# Patient Record
Sex: Female | Born: 1975 | Race: Black or African American | Hispanic: No | Marital: Single | Smoking: Never smoker
Health system: Southern US, Community
[De-identification: ages and names within clinical notes are randomized; demographics above are authoritative.]

## PROBLEM LIST (undated history)

## (undated) DIAGNOSIS — A599 Trichomoniasis, unspecified: Secondary | ICD-10-CM

## (undated) HISTORY — PX: INDUCED ABORTION: SHX677

---

## 2013-05-23 ENCOUNTER — Encounter (HOSPITAL_BASED_OUTPATIENT_CLINIC_OR_DEPARTMENT_OTHER): Payer: Self-pay | Admitting: Emergency Medicine

## 2013-05-23 ENCOUNTER — Emergency Department (HOSPITAL_BASED_OUTPATIENT_CLINIC_OR_DEPARTMENT_OTHER)
Admission: EM | Admit: 2013-05-23 | Discharge: 2013-05-23 | Disposition: A | Discharge status: Home / Self Care | Payer: BC Managed Care – PPO | Attending: Emergency Medicine | Admitting: Emergency Medicine

## 2013-05-23 DIAGNOSIS — K047 Periapical abscess without sinus: Secondary | ICD-10-CM | POA: Insufficient documentation

## 2013-05-23 DIAGNOSIS — K002 Abnormalities of size and form of teeth: Secondary | ICD-10-CM | POA: Insufficient documentation

## 2013-05-23 DIAGNOSIS — K089 Disorder of teeth and supporting structures, unspecified: Secondary | ICD-10-CM | POA: Insufficient documentation

## 2013-05-23 DIAGNOSIS — K0381 Cracked tooth: Secondary | ICD-10-CM | POA: Insufficient documentation

## 2013-05-23 DIAGNOSIS — K029 Dental caries, unspecified: Secondary | ICD-10-CM | POA: Insufficient documentation

## 2013-05-23 DIAGNOSIS — K0889 Other specified disorders of teeth and supporting structures: Secondary | ICD-10-CM

## 2013-05-23 DIAGNOSIS — R220 Localized swelling, mass and lump, head: Secondary | ICD-10-CM | POA: Insufficient documentation

## 2013-05-23 MED ORDER — HYDROCODONE-ACETAMINOPHEN 5-325 MG PO TABS: 1.0000 | ORAL_TABLET | Freq: Four times a day (QID) | ORAL | Status: DC | PRN

## 2013-05-23 MED ORDER — PENICILLIN V POTASSIUM 500 MG PO TABS: 500.0000 mg | ORAL_TABLET | Freq: Three times a day (TID) | ORAL | Status: DC

## 2013-05-23 MED ORDER — NAPROXEN 500 MG PO TABS: 500.0000 mg | ORAL_TABLET | Freq: Two times a day (BID) | ORAL | Status: DC

## 2013-05-23 NOTE — ED Provider Notes (Signed)
CSN: [id number]     Arrival date & time 05/23/13  1459 History   First MD Initiated Contact with Patient 05/23/13 1510     Chief Complaint  Patient presents with  . Dental Pain   (Consider location/radiation/quality/duration/timing/severity/associated sxs/prior Treatment) Patient is a 37 y.o. female presenting with tooth pain. The history is provided by the patient. No language interpreter was used.  Dental Pain Location:  Upper Upper teeth location:  16/LU 3rd molar Quality:  Pulsating and throbbing Severity:  Moderate Onset quality:  Sudden Duration:  1 day Timing:  Constant Chronicity:  New Context: dental fracture   Ineffective treatments:  None tried Associated symptoms: facial swelling   Associated symptoms: no fever and no neck swelling     History reviewed. No pertinent past medical history. History reviewed. No pertinent past surgical history. History reviewed. No pertinent family history. History  Substance Use Topics  . Smoking status: Never Smoker   . Smokeless tobacco: Not on file  . Alcohol Use: No   OB History   Grav Para Term Preterm Abortions TAB SAB Ect Mult Living                 Review of Systems  Constitutional: Negative for fever.  HENT: Positive for facial swelling.   All other systems reviewed and are negative.    Allergies  Review of patient's allergies indicates no known allergies.  Home Medications  No current outpatient prescriptions on file. BP 122/72  Pulse 93  Temp(Src) 98.8 F (37.1 C) (Oral)  Resp 16  Ht 5\' 4"  (1.626 m)  Wt 170 lb (77.111 kg)  BMI 29.17 kg/m2  SpO2 100% Physical Exam  Nursing note and vitals reviewed. Constitutional: She is oriented to person, place, and time. She appears well-developed and well-nourished.  HENT:  Head: Normocephalic.  Mouth/Throat: Abnormal dentition. Dental abscesses present.    Eyes: Conjunctivae are normal. Pupils are equal, round, and reactive to light.  Neck: Normal range of  motion.  Cardiovascular: Normal rate and regular rhythm.   Pulmonary/Chest: Effort normal and breath sounds normal.  Abdominal: Soft. Bowel sounds are normal.  Musculoskeletal: Normal range of motion.  Lymphadenopathy:    She has no cervical adenopathy.  Neurological: She is alert and oriented to person, place, and time.  Skin: Skin is warm and dry.  Psychiatric: She has a normal mood and affect. Her behavior is normal. Judgment and thought content normal.    ED Course  Procedures (including critical care time) Labs Review Labs Reviewed - No data to display Imaging Review No results found.  EKG Interpretation   None       MDM  Dental pain due to fractured tooth and infection.  Antibiotic, anti-inflammatory.  Referral to dentistry.  Resource information provided.    Jimmye Norman, NP 05/23/13 1714

## 2013-05-23 NOTE — ED Provider Notes (Signed)
Medical screening examination/treatment/procedure(s) were performed by non-physician practitioner and as supervising physician I was immediately available for consultation/collaboration.  EKG Interpretation   None         Charles B. Bernette Mayers, MD 05/23/13 [phone]

## 2013-05-23 NOTE — ED Notes (Signed)
Pt c/o dental pain x 1 day  

## 2014-06-08 ENCOUNTER — Emergency Department (HOSPITAL_BASED_OUTPATIENT_CLINIC_OR_DEPARTMENT_OTHER): Payer: Medicaid Other

## 2014-06-08 ENCOUNTER — Encounter (HOSPITAL_BASED_OUTPATIENT_CLINIC_OR_DEPARTMENT_OTHER): Payer: Self-pay | Admitting: Emergency Medicine

## 2014-06-08 ENCOUNTER — Emergency Department (HOSPITAL_BASED_OUTPATIENT_CLINIC_OR_DEPARTMENT_OTHER)
Admission: EM | Admit: 2014-06-08 | Discharge: 2014-06-09 | Disposition: A | Discharge status: Home / Self Care | Payer: Medicaid Other | Attending: Emergency Medicine | Admitting: Emergency Medicine

## 2014-06-08 DIAGNOSIS — Z791 Long term (current) use of non-steroidal anti-inflammatories (NSAID): Secondary | ICD-10-CM | POA: Insufficient documentation

## 2014-06-08 DIAGNOSIS — R0602 Shortness of breath: Secondary | ICD-10-CM | POA: Diagnosis present

## 2014-06-08 DIAGNOSIS — R0789 Other chest pain: Secondary | ICD-10-CM | POA: Diagnosis not present

## 2014-06-08 DIAGNOSIS — R079 Chest pain, unspecified: Secondary | ICD-10-CM

## 2014-06-08 LAB — CBC WITH DIFFERENTIAL/PLATELET
Basophils Absolute: 0 10*3/uL (ref 0.0–0.1)
Basophils Relative: 0 % (ref 0–1)
Eosinophils Absolute: 0.1 10*3/uL (ref 0.0–0.7)
Eosinophils Relative: 2 % (ref 0–5)
HCT: 34.5 % — ABNORMAL LOW (ref 36.0–46.0)
HEMOGLOBIN: 11.6 g/dL — AB (ref 12.0–15.0)
LYMPHS ABS: 2.8 10*3/uL (ref 0.7–4.0)
LYMPHS PCT: 38 % (ref 12–46)
MCH: 26.4 pg (ref 26.0–34.0)
MCHC: 33.6 g/dL (ref 30.0–36.0)
MCV: 78.4 fL (ref 78.0–100.0)
MONOS PCT: 9 % (ref 3–12)
Monocytes Absolute: 0.7 10*3/uL (ref 0.1–1.0)
NEUTROS PCT: 51 % (ref 43–77)
Neutro Abs: 3.9 10*3/uL (ref 1.7–7.7)
Platelets: 259 10*3/uL (ref 150–400)
RBC: 4.4 MIL/uL (ref 3.87–5.11)
RDW: 13.4 % (ref 11.5–15.5)
WBC: 7.5 10*3/uL (ref 4.0–10.5)

## 2014-06-08 NOTE — ED Notes (Signed)
Pt states that she started having shoulder pain yesterday, denies any injury.  Pt states she is only SOB when she lie down.

## 2014-06-08 NOTE — ED Provider Notes (Signed)
CSN: [id number]     Arrival date & time 06/08/14  2214 History  This chart was scribed for Gilda Creasehristopher J. Jalexis Breed, * by Roxy Cedarhandni Bhalodia, ED Scribe. This patient was seen in room MH12/MH12 and the patient's care was started at 11:28 PM.   Chief Complaint  Patient presents with  . bilateral shoulder pain    The history is provided by the patient. No language interpreter was used.   HPI Comments: Christy Carroll is a 38 y.o. female who presents to the Emergency Department complaining of moderate SOB that began yesterday. She denies associated cough, SOB or sore throat. She states that the pain hurts the most in clavicular area. She states that the pain worsens when sitting down for long periods of time.  History reviewed. No pertinent past medical history. History reviewed. No pertinent past surgical history. History reviewed. No pertinent family history. History  Substance Use Topics  . Smoking status: Never Smoker   . Smokeless tobacco: Not on file  . Alcohol Use: No   OB History    No data available     Review of Systems  Musculoskeletal: Positive for arthralgias.  All other systems reviewed and are negative.  Allergies  Review of patient's allergies indicates no known allergies.  Home Medications   Prior to Admission medications   Medication Sig Start Date End Date Taking? Authorizing Provider  naproxen (NAPROSYN) 500 MG tablet Take 1 tablet (500 mg total) by mouth 2 (two) times daily. 06/09/14   Gilda Creasehristopher J. Magaby Rumberger, MD  traMADol (ULTRAM) 50 MG tablet Take 1 tablet (50 mg total) by mouth every 6 (six) hours as needed. 06/09/14   Gilda Creasehristopher J. Glorianna Gott, MD   Triage Vitals: BP 125/72 mmHg  Pulse 82  Temp(Src) 98 F (36.7 C) (Oral)  Resp 18  Ht 5\' 5"  (1.651 m)  Wt 177 lb (80.287 kg)  BMI 29.45 kg/m2  SpO2 100%  Physical Exam  Constitutional: She is oriented to person, place, and time. She appears well-developed and well-nourished. No distress.  HENT:  Head:  Normocephalic and atraumatic.  Right Ear: Hearing normal.  Left Ear: Hearing normal.  Nose: Nose normal.  Mouth/Throat: Oropharynx is clear and moist and mucous membranes are normal.  Eyes: Conjunctivae and EOM are normal. Pupils are equal, round, and reactive to light.  Neck: Normal range of motion. Neck supple.  Cardiovascular: Regular rhythm, S1 normal and S2 normal.  Exam reveals no gallop and no friction rub.   No murmur heard. Pulmonary/Chest: Effort normal and breath sounds normal. No respiratory distress. She exhibits tenderness.  Abdominal: Soft. Normal appearance and bowel sounds are normal. There is no hepatosplenomegaly. There is no tenderness. There is no rebound, no guarding, no tenderness at McBurney's point and negative Murphy's sign. No hernia.  Musculoskeletal: Normal range of motion. She exhibits tenderness.  Tenderness across upper chest wall.  Neurological: She is alert and oriented to person, place, and time. She has normal strength. No cranial nerve deficit or sensory deficit. Coordination normal. GCS eye subscore is 4. GCS verbal subscore is 5. GCS motor subscore is 6.  Skin: Skin is warm, dry and intact. No rash noted. No cyanosis.  Psychiatric: She has a normal mood and affect. Her speech is normal and behavior is normal. Thought content normal.  Nursing note and vitals reviewed.  ED Course  Procedures (including critical care time)  DIAGNOSTIC STUDIES: Oxygen Saturation is 100% on RA, normal by my interpretation.    COORDINATION OF CARE: 11:29 PM- Discussed  plans to order diagnostic CXR, EKG and lab work. Pt advised of plan for treatment and pt agrees.  Labs Review Labs Reviewed  CBC WITH DIFFERENTIAL - Abnormal; Notable for the following:    Hemoglobin 11.6 (*)    HCT 34.5 (*)    All other components within normal limits  BASIC METABOLIC PANEL - Abnormal; Notable for the following:    Potassium 3.4 (*)    Glucose, Bld 104 (*)    All other components  within normal limits  TROPONIN I    Imaging Review Dg Chest 2 View  06/09/2014   CLINICAL DATA:  Mid chest pain for 3 days.  EXAM: CHEST  2 VIEW  COMPARISON:  None.  FINDINGS: Normal heart size and mediastinal contours. No acute infiltrate or edema. No effusion or pneumothorax. No acute osseous findings.  IMPRESSION: Negative chest.   Electronically Signed   By: Tiburcio PeaJonathan  Watts M.D.   On: 06/09/2014 00:05     EKG Interpretation   Date/Time:  Wednesday June 08 2014 22:24:07 EST Ventricular Rate:  84 PR Interval:  136 QRS Duration: 84 QT Interval:  368 QTC Calculation: 434 R Axis:   77 Text Interpretation:  Normal sinus rhythm Normal ECG Confirmed by Khiem Gargis   MD, [place] [phone]([id number]) on 06/08/2014 11:29:26 PM     MDM   Final diagnoses:  Chest pain  Chest wall pain    Resents to the ER for evaluation of pain across the anterior upper chest bilaterally. Patient reports that she has noticed some increased pain when she lies down, but the pain is also reproducible with movement and palpation. Patient has a normal EKG. Chest x-ray was normal. Vital signs are normal. She does not have any tachycardia. Symptoms are more consistent with chest wall pain than PE. PERC/[facility] . Negative for PE, no workup necessary. Patient was reassured, symptoms most consistent with musculoskeletal pain and will treat symptomatically.  I personally performed the services described in this documentation, which was scribed in my presence. The recorded information has been reviewed and is accurate.  Gilda Creasehristopher J. Trinika Cortese, MD 06/09/14 [phone]

## 2014-06-09 LAB — BASIC METABOLIC PANEL
Anion gap: 6 (ref 5–15)
BUN: 10 mg/dL (ref 6–23)
CO2: 26 mmol/L (ref 19–32)
Calcium: 8.9 mg/dL (ref 8.4–10.5)
Chloride: 106 mEq/L (ref 96–112)
Creatinine, Ser: 0.73 mg/dL (ref 0.50–1.10)
GFR calc Af Amer: >90 mL/min (ref >90)
GFR calc non Af Amer: >90 mL/min (ref >90)
GLUCOSE: 104 mg/dL — AB (ref 70–99)
POTASSIUM: 3.4 mmol/L — AB (ref 3.5–5.1)
SODIUM: 138 mmol/L (ref 135–145)

## 2014-06-09 LAB — TROPONIN I: Troponin I: <0.03 ng/mL (ref <0.031)

## 2014-06-09 MED ORDER — TRAMADOL HCL 50 MG PO TABS: 50.0000 mg | ORAL_TABLET | Freq: Four times a day (QID) | ORAL | Status: DC | PRN

## 2014-06-09 MED ORDER — NAPROXEN 500 MG PO TABS: 500.0000 mg | ORAL_TABLET | Freq: Two times a day (BID) | ORAL | Status: DC

## 2014-06-09 NOTE — Discharge Instructions (Signed)

## 2014-06-12 ENCOUNTER — Emergency Department (HOSPITAL_BASED_OUTPATIENT_CLINIC_OR_DEPARTMENT_OTHER)
Admission: EM | Admit: 2014-06-12 | Discharge: 2014-06-12 | Disposition: A | Discharge status: Home / Self Care | Payer: Medicaid Other | Attending: Emergency Medicine | Admitting: Emergency Medicine

## 2014-06-12 ENCOUNTER — Encounter (HOSPITAL_BASED_OUTPATIENT_CLINIC_OR_DEPARTMENT_OTHER): Payer: Self-pay | Admitting: *Deleted

## 2014-06-12 DIAGNOSIS — Z3202 Encounter for pregnancy test, result negative: Secondary | ICD-10-CM | POA: Insufficient documentation

## 2014-06-12 DIAGNOSIS — A599 Trichomoniasis, unspecified: Secondary | ICD-10-CM

## 2014-06-12 DIAGNOSIS — N390 Urinary tract infection, site not specified: Secondary | ICD-10-CM | POA: Insufficient documentation

## 2014-06-12 DIAGNOSIS — Z791 Long term (current) use of non-steroidal anti-inflammatories (NSAID): Secondary | ICD-10-CM | POA: Diagnosis not present

## 2014-06-12 DIAGNOSIS — A5901 Trichomonal vulvovaginitis: Secondary | ICD-10-CM | POA: Diagnosis not present

## 2014-06-12 DIAGNOSIS — N898 Other specified noninflammatory disorders of vagina: Secondary | ICD-10-CM | POA: Diagnosis present

## 2014-06-12 LAB — WET PREP, GENITAL
Clue Cells Wet Prep HPF POC: NONE SEEN
YEAST WET PREP: NONE SEEN

## 2014-06-12 LAB — URINALYSIS, ROUTINE W REFLEX MICROSCOPIC
Bilirubin Urine: NEGATIVE
Glucose, UA: NEGATIVE mg/dL
Ketones, ur: NEGATIVE mg/dL
Nitrite: NEGATIVE
PH: 6.5 (ref 5.0–8.0)
Protein, ur: NEGATIVE mg/dL
SPECIFIC GRAVITY, URINE: 1.018 (ref 1.005–1.030)
Urobilinogen, UA: 1 mg/dL (ref 0.0–1.0)

## 2014-06-12 LAB — URINE MICROSCOPIC-ADD ON

## 2014-06-12 LAB — HIV ANTIBODY (ROUTINE TESTING W REFLEX): HIV 1&2 Ab, 4th Generation: NONREACTIVE

## 2014-06-12 LAB — RPR

## 2014-06-12 LAB — PREGNANCY, URINE: Preg Test, Ur: NEGATIVE

## 2014-06-12 MED ORDER — METRONIDAZOLE 500 MG PO TABS: 500.0000 mg | ORAL_TABLET | Freq: Two times a day (BID) | ORAL | Status: DC

## 2014-06-12 MED ORDER — CEPHALEXIN 500 MG PO CAPS: 500.0000 mg | ORAL_CAPSULE | Freq: Four times a day (QID) | ORAL | Status: DC

## 2014-06-12 NOTE — ED Notes (Signed)
Patient states she is having lower abd pain and vaginal discharge that she thought was a yeast in fection, but otc meds are not working

## 2014-06-12 NOTE — Discharge Instructions (Signed)
Trichomoniasis °Trichomoniasis is an infection caused by an organism called Trichomonas. The infection can affect both women and men. In women, the outer female genitalia and the vagina are affected. In men, the penis is mainly affected, but the prostate and other reproductive organs can also be involved. Trichomoniasis is a sexually transmitted infection (STI) and is most often passed to another person through sexual contact.  °RISK FACTORS °· Having unprotected sexual intercourse. °· Having sexual intercourse with an infected partner. °SIGNS AND SYMPTOMS  °Symptoms of trichomoniasis in women include: °· Abnormal gray-green frothy vaginal discharge. °· Itching and irritation of the vagina. °· Itching and irritation of the area outside the vagina. °Symptoms of trichomoniasis in men include:  °· Penile discharge with or without pain. °· Pain during urination. This results from inflammation of the urethra. °DIAGNOSIS  °Trichomoniasis may be found during a Pap test or physical exam. Your health care provider may use one of the following methods to help diagnose this infection: °· Examining vaginal discharge under a microscope. For men, urethral discharge would be examined. °· Testing the pH of the vagina with a test tape. °· Using a vaginal swab test that checks for the Trichomonas organism. A test is available that provides results within a few minutes. °· Doing a culture test for the organism. This is not usually needed. °TREATMENT  °· You may be given medicine to fight the infection. Women should inform their health care provider if they could be or are pregnant. Some medicines used to treat the infection should not be taken during pregnancy. °· Your health care provider may recommend over-the-counter medicines or creams to decrease itching or irritation. °· Your sexual partner will need to be treated if infected. °HOME CARE INSTRUCTIONS  °· Take medicines only as directed by your health care provider. °· Take  over-the-counter medicine for itching or irritation as directed by your health care provider. °· Do not have sexual intercourse while you have the infection. °· Women should not douche or wear tampons while they have the infection. °· Discuss your infection with your partner. Your partner may have gotten the infection from you, or you may have gotten it from your partner. °· Have your sex partner get examined and treated if necessary. °· Practice safe, informed, and protected sex. °· See your health care provider for other STI testing. °SEEK MEDICAL CARE IF:  °· You still have symptoms after you finish your medicine. °· You develop abdominal pain. °· You have pain when you urinate. °· You have bleeding after sexual intercourse. °· You develop a rash. °· Your medicine makes you sick or makes you throw up (vomit). °MAKE SURE YOU: °· Understand these instructions. °· Will watch your condition. °· Will get help right away if you are not doing well or get worse. °Document Released: 11/20/2000 Document Revised: 10/11/2013 Document Reviewed: 03/08/2013 °ExitCare® Patient Information ©2015 [employer]. This information is not intended to replace advice given to you by your health care provider. Make sure you discuss any questions you have with your health care provider. ° °Urinary Tract Infection °Urinary tract infections (UTIs) can develop anywhere along your urinary tract. Your urinary tract is your body's drainage system for removing wastes and extra water. Your urinary tract includes two kidneys, two ureters, a bladder, and a urethra. Your kidneys are a pair of bean-shaped organs. Each kidney is about the size of your fist. They are located below your ribs, one on each side of your spine. °CAUSES °Infections   are caused by microbes, which are microscopic organisms, including fungi, viruses, and bacteria. These organisms are so small that they can only be seen through a microscope. Bacteria are the microbes that most  commonly cause UTIs. °SYMPTOMS  °Symptoms of UTIs may vary by age and gender of the patient and by the location of the infection. Symptoms in young women typically include a frequent and intense urge to urinate and a painful, burning feeling in the bladder or urethra during urination. Older women and men are more likely to be tired, shaky, and weak and have muscle aches and abdominal pain. A fever may mean the infection is in your kidneys. Other symptoms of a kidney infection include pain in your back or sides below the ribs, nausea, and vomiting. °DIAGNOSIS °To diagnose a UTI, your caregiver will ask you about your symptoms. Your caregiver also will ask to provide a urine sample. The urine sample will be tested for bacteria and white blood cells. White blood cells are made by your body to help fight infection. °TREATMENT  °Typically, UTIs can be treated with medication. Because most UTIs are caused by a bacterial infection, they usually can be treated with the use of antibiotics. The choice of antibiotic and length of treatment depend on your symptoms and the type of bacteria causing your infection. °HOME CARE INSTRUCTIONS °· If you were prescribed antibiotics, take them exactly as your caregiver instructs you. Finish the medication even if you feel better after you have only taken some of the medication. °· Drink enough water and fluids to keep your urine clear or pale yellow. °· Avoid caffeine, tea, and carbonated beverages. They tend to irritate your bladder. °· Empty your bladder often. Avoid holding urine for long periods of time. °· Empty your bladder before and after sexual intercourse. °· After a bowel movement, women should cleanse from front to back. Use each tissue only once. °SEEK MEDICAL CARE IF:  °· You have back pain. °· You develop a fever. °· Your symptoms do not begin to resolve within 3 days. °SEEK IMMEDIATE MEDICAL CARE IF:  °· You have severe back pain or lower abdominal pain. °· You develop  chills. °· You have nausea or vomiting. °· You have continued burning or discomfort with urination. °MAKE SURE YOU:  °· Understand these instructions. °· Will watch your condition. °· Will get help right away if you are not doing well or get worse. °Document Released: 03/06/2005 Document Revised: 11/26/2011 Document Reviewed: 07/05/2011 °ExitCare® Patient Information ©2015 [employer]. This information is not intended to replace advice given to you by your health care provider. Make sure you discuss any questions you have with your health care provider. ° °

## 2014-06-12 NOTE — ED Provider Notes (Signed)
CSN: [id number]     Arrival date & time 06/12/14  1416 History   First MD Initiated Contact with Patient 06/12/14 1530     Chief Complaint  Patient presents with  . Vaginal Discharge   Christy Carroll is a 39 y.o. female who presents to emergency department complaining of low abdominal pain and vaginal discharge for the past 2 days. Patient reports she's tried Monistat without relief. Patient put she sexually active and is not using protection. Patient does report a history of chlamydia. Patient is on Depo-Provera for birth control. Patient rates her abdominal pain at 5 out of 10 and worse with movement. The patient denies nausea, vomiting, constipation, diarrhea, fevers, chills, dysuria, urinary frequency, urinary urgency, hematuria, vaginal bleeding, or weakness.  (Consider location/radiation/quality/duration/timing/severity/associated sxs/prior Treatment) HPI  History reviewed. No pertinent past medical history. History reviewed. No pertinent past surgical history. No family history on file. History  Substance Use Topics  . Smoking status: Never Smoker   . Smokeless tobacco: Not on file  . Alcohol Use: No   OB History    No data available     Review of Systems  Constitutional: Negative for fever and chills.  HENT: Negative for congestion, ear pain and sore throat.   Eyes: Negative for visual disturbance.  Respiratory: Negative for cough, shortness of breath and wheezing.   Cardiovascular: Negative for chest pain and palpitations.  Gastrointestinal: Positive for abdominal pain. Negative for nausea, vomiting, diarrhea and blood in stool.  Genitourinary: Positive for vaginal discharge. Negative for dysuria, urgency, frequency, hematuria, flank pain, vaginal bleeding, difficulty urinating, genital sores, vaginal pain and pelvic pain.  Musculoskeletal: Negative for back pain and neck pain.  Skin: Negative for rash.  Neurological: Negative for headaches.      Allergies  Review of  patient's allergies indicates no known allergies.  Home Medications   Prior to Admission medications   Medication Sig Start Date End Date Taking? Authorizing Provider  cephALEXin (KEFLEX) 500 MG capsule Take 1 capsule (500 mg total) by mouth 4 (four) times daily. 06/12/14   Einar Gip Franciszek Platten, PA-C  metroNIDAZOLE (FLAGYL) 500 MG tablet Take 1 tablet (500 mg total) by mouth 2 (two) times daily. 06/12/14   Einar Gip Carisha Kantor, PA-C  naproxen (NAPROSYN) 500 MG tablet Take 1 tablet (500 mg total) by mouth 2 (two) times daily. 06/09/14   Gilda Crease, MD  traMADol (ULTRAM) 50 MG tablet Take 1 tablet (50 mg total) by mouth every 6 (six) hours as needed. 06/09/14   Gilda Crease, MD   BP 104/54 mmHg  Pulse 73  Temp(Src) 98.2 F (36.8 C) (Oral)  Resp 16  Ht  (1.651 m)  Wt 177 lb (80.287 kg)  BMI 29.45 kg/m2  SpO2 100% Physical Exam  Constitutional: She appears well-developed and well-nourished. No distress.  HENT:  Head: Normocephalic and atraumatic.  Mouth/Throat: Oropharynx is clear and moist.  Eyes: Conjunctivae are normal. Pupils are equal, round, and reactive to light. Right eye exhibits no discharge. Left eye exhibits no discharge.  Neck: Neck supple.  Cardiovascular: Normal rate, regular rhythm, normal heart sounds and intact distal pulses.  Exam reveals no gallop and no friction rub.   No murmur heard. Pulmonary/Chest: Effort normal and breath sounds normal. No respiratory distress. She has no wheezes. She has no rales.  Abdominal: Soft. Bowel sounds are normal. She exhibits no distension and no mass. There is tenderness. There is no rebound and no guarding.  Patient's abdomen is soft. There  is very mild left lower quadrant abdominal tenderness. Bowel sounds are present. No CVA tenderness. Negative psoas and obturator sign.  Genitourinary: There is no rash, tenderness, lesion or injury on the right labia. There is no rash, tenderness, lesion or injury on the  left labia. Uterus is not enlarged and not tender. Cervix exhibits no motion tenderness. Right adnexum displays no mass, no tenderness and no fullness. Left adnexum displays no mass, no tenderness and no fullness. No erythema, tenderness or bleeding in the vagina. No foreign body around the vagina. No signs of injury around the vagina. Vaginal discharge found.  Pelvic exam performed by me with female RN chaperone. Patient has no external lesions. There is a moderate amount of white vaginal discharge noted. Patient's cervix is closed. There is no cervical motion tenderness. There is no vaginal bleeding noted. There is no adnexal fullness or tenderness noted.  Musculoskeletal: She exhibits no edema.  Lymphadenopathy:    She has no cervical adenopathy.  Neurological: She is alert. Coordination normal.  Skin: Skin is warm and dry. No rash noted. She is not diaphoretic. No erythema. No pallor.  Psychiatric: She has a normal mood and affect. Her behavior is normal.  Nursing note and vitals reviewed.   ED Course  Procedures (including critical care time) Labs Review Labs Reviewed  WET PREP, GENITAL - Abnormal; Notable for the following:    Trich, Wet Prep FEW (*)    WBC, Wet Prep HPF POC TOO NUMEROUS TO COUNT (*)    All other components within normal limits  URINALYSIS, ROUTINE W REFLEX MICROSCOPIC - Abnormal; Notable for the following:    APPearance CLOUDY (*)    Hgb urine dipstick MODERATE (*)    Leukocytes, UA LARGE (*)    All other components within normal limits  URINE MICROSCOPIC-ADD ON - Abnormal; Notable for the following:    Squamous Epithelial / LPF FEW (*)    Bacteria, UA MANY (*)    All other components within normal limits  GC/CHLAMYDIA PROBE AMP  PREGNANCY, URINE  HIV ANTIBODY (ROUTINE TESTING)  RPR    Imaging Review No results found.   EKG Interpretation None      Filed Vitals:   06/12/14 1419 06/12/14 1642  BP: 125/72 104/54  Pulse: 94 73  Temp: 98.2 F (36.8 C)    TempSrc: Oral   Resp: 18 16  Height:  (1.651 m)   Weight: 177 lb (80.287 kg)   SpO2: 99% 100%     MDM   Meds given in ED:  Medications - No data to display  Discharge Medication List as of 06/12/2014  5:43 PM    START taking these medications   Details  cephALEXin (KEFLEX) 500 MG capsule Take 1 capsule (500 mg total) by mouth 4 (four) times daily., Starting 06/12/2014, Until Discontinued, Print    metroNIDAZOLE (FLAGYL) 500 MG tablet Take 1 tablet (500 mg total) by mouth 2 (two) times daily., Starting 06/12/2014, Until Discontinued, Print        Final diagnoses:  UTI (lower urinary tract infection)  Trichimoniasis   Visit there a 39-year-old female who presented to the emergency department complaining of vaginal discharge for the past 2 days. Patient also reports some suprapubic pain that she rates at 5 out of 10. Patient denies nausea, vomiting, constipation or diarrhea. Patient has fever and chills. Patient denies urinary symptoms. The patient is afebrile and nontoxic-appearing. The patient's wet prep returned with trichomonas. The patient's urinalysis indicated large leuks and  many bacteria, as well as too numerous to count white blood cells. We'll treat this patient for a urinary tract infection as well as trichomonas. Advised patient she needs to follow-up with her primary care provider this week. Advised patient return to the emergency department with new or worsening symptoms or new concerns. Patient verbalized understanding and agreement of plan.      Lawana Chambers, PA-C 06/13/14 0109  Nelia Shi, MD 06/16/14 [phone]

## 2014-06-15 LAB — GC/CHLAMYDIA PROBE AMP
CT Probe RNA: NEGATIVE
GC Probe RNA: NEGATIVE

## 2014-08-28 ENCOUNTER — Emergency Department (HOSPITAL_BASED_OUTPATIENT_CLINIC_OR_DEPARTMENT_OTHER)
Admission: EM | Admit: 2014-08-28 | Discharge: 2014-08-28 | Disposition: A | Discharge status: Home / Self Care | Payer: Medicaid Other | Attending: Emergency Medicine | Admitting: Emergency Medicine

## 2014-08-28 ENCOUNTER — Encounter (HOSPITAL_BASED_OUTPATIENT_CLINIC_OR_DEPARTMENT_OTHER): Payer: Self-pay

## 2014-08-28 DIAGNOSIS — K029 Dental caries, unspecified: Secondary | ICD-10-CM | POA: Insufficient documentation

## 2014-08-28 DIAGNOSIS — K088 Other specified disorders of teeth and supporting structures: Secondary | ICD-10-CM | POA: Diagnosis present

## 2014-08-28 MED ORDER — PENICILLIN V POTASSIUM 500 MG PO TABS: 500.0000 mg | ORAL_TABLET | Freq: Three times a day (TID) | ORAL | Status: AC

## 2014-08-28 MED ORDER — TRAMADOL HCL 50 MG PO TABS: 50.0000 mg | ORAL_TABLET | Freq: Four times a day (QID) | ORAL | Status: DC | PRN

## 2014-08-28 NOTE — Discharge Instructions (Signed)
Dental Caries  Dental caries (also called tooth decay) is the most common oral disease. It can occur at any age but is more common in children and young adults.   HOW DENTAL CARIES DEVELOPS   The process of decay begins when bacteria and foods (particularly sugars and starches) combine in your mouth to produce plaque. Plaque is a substance that sticks to the hard, outer surface of a tooth (enamel). The bacteria in plaque produce acids that attack enamel. These acids may also attack the root surface of a tooth (cementum) if it is exposed. Repeated attacks dissolve these surfaces and create holes in the tooth (cavities). If left untreated, the acids destroy the other layers of the tooth.   RISK FACTORS   Frequent sipping of sugary beverages.    Frequent snacking on sugary and starchy foods, especially those that easily get stuck in the teeth.    Poor oral hygiene.    Dry mouth.    Substance abuse such as methamphetamine abuse.    Broken or poor-fitting dental restorations.    Eating disorders.    Gastroesophageal reflux disease (GERD).    Certain radiation treatments to the head and neck.  SYMPTOMS  In the early stages of dental caries, symptoms are seldom present. Sometimes white, chalky areas may be seen on the enamel or other tooth layers. In later stages, symptoms may include:   Pits and holes on the enamel.   Toothache after sweet, hot, or cold foods or drinks are consumed.   Pain around the tooth.   Swelling around the tooth.  DIAGNOSIS   Most of the time, dental caries is detected during a regular dental checkup. A diagnosis is made after a thorough medical and dental history is taken and the surfaces of your teeth are checked for signs of dental caries. Sometimes special instruments, such as lasers, are used to check for dental caries. Dental X-ray exams may be taken so that areas not visible to the eye (such as between the contact areas of the teeth) can be checked for cavities.    TREATMENT   If dental caries is in its early stages, it may be reversed with a fluoride treatment or an application of a remineralizing agent at the dental office. Thorough brushing and flossing at home is needed to aid these treatments. If it is in its later stages, treatment depends on the location and extent of tooth destruction:    If a small area of the tooth has been destroyed, the destroyed area will be removed and cavities will be filled with a material such as gold, silver amalgam, or composite resin.    If a large area of the tooth has been destroyed, the destroyed area will be removed and a cap (crown) will be fitted over the remaining tooth structure.    If the center part of the tooth (pulp) is affected, a procedure called a root canal will be needed before a filling or crown can be placed.    If most of the tooth has been destroyed, the tooth may need to be pulled (extracted).  HOME CARE INSTRUCTIONS  You can prevent, stop, or reverse dental caries at home by practicing good oral hygiene. Good oral hygiene includes:   Thoroughly cleaning your teeth at least twice a day with a toothbrush and dental floss.    Using a fluoride toothpaste. A fluoride mouth rinse may also be used if recommended by your dentist or health care provider.    Restricting   the amount of sugary and starchy foods and sugary liquids you consume.    Avoiding frequent snacking on these foods and sipping of these liquids.    Keeping regular visits with a dentist for checkups and cleanings.  PREVENTION    Practice good oral hygiene.   Consider a dental sealant. A dental sealant is a coating material that is applied by your dentist to the pits and grooves of teeth. The sealant prevents food from being trapped in them. It may protect the teeth for several years.   Ask about fluoride supplements if you live in a community without fluorinated water or with water that has a low fluoride content. Use fluoride supplements  as directed by your dentist or health care provider.   Allow fluoride varnish applications to teeth if directed by your dentist or health care provider.  Document Released: 02/16/2002 Document Revised: 10/11/2013 Document Reviewed: 05/29/2012  [employer] Patient Information 2015 [employer]. This information is not intended to replace advice given to you by your health care provider. Make sure you discuss any questions you have with your health care provider.

## 2014-08-28 NOTE — ED Provider Notes (Signed)
CSN: [id number]     Arrival date & time 08/28/14  1349 History   First MD Initiated Contact with Patient 08/28/14 1458     Chief Complaint  Patient presents with  . Dental Pain     (Consider location/radiation/quality/duration/timing/severity/associated sxs/prior Treatment) HPI Comments: Patient presents with pain to her left upper tooth. It's been hurting for last 5 days she's had a cavity for several months. She's been taking over-the-counter medicines without relief. She denies any fevers or vomiting. She currently does not have a dentist. She denies any facial swelling.  Patient is a 39 y.o. female presenting with tooth pain.  Dental Pain Associated symptoms: no facial swelling, no fever and no headaches     History reviewed. No pertinent past medical history. History reviewed. No pertinent past surgical history. No family history on file. History  Substance Use Topics  . Smoking status: Never Smoker   . Smokeless tobacco: Not on file  . Alcohol Use: No   OB History    No data available     Review of Systems  Constitutional: Negative for fever.  HENT: Positive for dental problem. Negative for facial swelling.   Respiratory: Negative for shortness of breath.   Gastrointestinal: Negative for nausea and vomiting.  Skin: Negative for rash.  Neurological: Negative for headaches.      Allergies  Review of patient's allergies indicates no known allergies.  Home Medications   Prior to Admission medications   Medication Sig Start Date End Date Taking? Authorizing Provider  penicillin v potassium (VEETID) 500 MG tablet Take 1 tablet (500 mg total) by mouth 3 (three) times daily. 08/28/14 09/04/14  Rolan BuccoMelanie Sherree Shankman, MD  traMADol (ULTRAM) 50 MG tablet Take 1 tablet (50 mg total) by mouth every 6 (six) hours as needed. 08/28/14   Rolan BuccoMelanie Wynelle Dreier, MD   BP 137/83 mmHg  Pulse 81  Temp(Src) 98.8 F (37.1 C) (Oral)  Resp 18  Ht 5\' 4"  (1.626 m)  Wt 180 lb (81.647 kg)  BMI 30.88  kg/m2  SpO2 98% Physical Exam  Constitutional: She is oriented to person, place, and time. She appears well-developed and well-nourished.  HENT:  Patient has tenderness to the left upper first molar. There some decay to this tooth. There is no swelling around the tooth. No trismus.  Cardiovascular: Normal rate.   Pulmonary/Chest: Effort normal.  Neurological: She is alert and oriented to person, place, and time.    ED Course  Procedures (including critical care time) Labs Review Labs Reviewed - No data to display  Imaging Review No results found.   EKG Interpretation None      MDM   Final diagnoses:  Dental caries    Patient started on penicillin and tramadol for pain. She was given a referral to follow-up with Dentist. She was advised to return if she has any worsening symptoms.    Rolan BuccoMelanie Jhostin Epps, MD 08/28/14 [phone]

## 2014-08-28 NOTE — ED Notes (Signed)
Patient here with left upper dental pain x 5 days, reports that the tooth is broken and no relief with otc meds

## 2014-11-30 ENCOUNTER — Encounter (HOSPITAL_BASED_OUTPATIENT_CLINIC_OR_DEPARTMENT_OTHER): Payer: Self-pay

## 2014-11-30 ENCOUNTER — Emergency Department (HOSPITAL_BASED_OUTPATIENT_CLINIC_OR_DEPARTMENT_OTHER): Payer: Medicaid Other

## 2014-11-30 ENCOUNTER — Emergency Department (HOSPITAL_BASED_OUTPATIENT_CLINIC_OR_DEPARTMENT_OTHER)
Admission: EM | Admit: 2014-11-30 | Discharge: 2014-11-30 | Disposition: A | Discharge status: Home / Self Care | Payer: Medicaid Other | Attending: Emergency Medicine | Admitting: Emergency Medicine

## 2014-11-30 DIAGNOSIS — N830 Follicular cyst of ovary: Secondary | ICD-10-CM | POA: Insufficient documentation

## 2014-11-30 DIAGNOSIS — Z3202 Encounter for pregnancy test, result negative: Secondary | ICD-10-CM | POA: Insufficient documentation

## 2014-11-30 DIAGNOSIS — R140 Abdominal distension (gaseous): Secondary | ICD-10-CM

## 2014-11-30 DIAGNOSIS — N83209 Unspecified ovarian cyst, unspecified side: Secondary | ICD-10-CM

## 2014-11-30 DIAGNOSIS — R102 Pelvic and perineal pain: Secondary | ICD-10-CM

## 2014-11-30 DIAGNOSIS — R109 Unspecified abdominal pain: Secondary | ICD-10-CM | POA: Diagnosis present

## 2014-11-30 DIAGNOSIS — N938 Other specified abnormal uterine and vaginal bleeding: Secondary | ICD-10-CM | POA: Diagnosis not present

## 2014-11-30 LAB — URINALYSIS, ROUTINE W REFLEX MICROSCOPIC
BILIRUBIN URINE: NEGATIVE
Glucose, UA: NEGATIVE mg/dL
Ketones, ur: NEGATIVE mg/dL
Nitrite: NEGATIVE
PH: 6 (ref 5.0–8.0)
Protein, ur: NEGATIVE mg/dL
Specific Gravity, Urine: 1.013 (ref 1.005–1.030)
UROBILINOGEN UA: 1 mg/dL (ref 0.0–1.0)

## 2014-11-30 LAB — URINE MICROSCOPIC-ADD ON

## 2014-11-30 LAB — PREGNANCY, URINE: PREG TEST UR: NEGATIVE

## 2014-11-30 NOTE — ED Provider Notes (Signed)
CSN: [id number]     Arrival date & time 11/30/14  1744 History   First MD Initiated Contact with Patient 11/30/14 1901     Chief Complaint  Patient presents with  . Abdominal Pain     (Consider location/radiation/quality/duration/timing/severity/associated sxs/prior Treatment) HPI Comments: 39 year old female complaining of abdominal pain and vaginal bleeding 5 days. Had a depo shot [facility]/14 [facility] where the same time she had a pelvic exam with a Pap smear and culture tests which were all negative. Has been on Depo for about 1-2 years, has occasional spotting, however has not had bleeding like she's been having for the past 5 days. She is bleeding through 4-5 pads daily. States her lower abdomen feels bloated and tender, as if she has gas, however is having normal bowel movements and is not passing excessive flatus. Denies fever, chills, nausea, vomiting or diarrhea.  Patient is a 39 y.o. female presenting with abdominal pain. The history is provided by the patient.  Abdominal Pain Associated symptoms: vaginal bleeding     History reviewed. No pertinent past medical history. History reviewed. No pertinent past surgical history. No family history on file. History  Substance Use Topics  . Smoking status: Never Smoker   . Smokeless tobacco: Not on file  . Alcohol Use: No   OB History    No data available     Review of Systems  Gastrointestinal: Positive for abdominal pain.  Genitourinary: Positive for vaginal bleeding.  All other systems reviewed and are negative.     Allergies  Review of patient's allergies indicates no known allergies.  Home Medications   Prior to Admission medications   Not on File   BP 126/81 mmHg  Pulse 85  Temp(Src) 98.9 F (37.2 C) (Oral)  Resp 16  Ht 5' 4.5" (1.638 m)  Wt 175 lb (79.379 kg)  BMI 29.59 kg/m2  SpO2 100% Physical Exam  Constitutional: She is oriented to person, place, and time. She appears well-developed  and well-nourished. No distress.  HENT:  Head: Normocephalic and atraumatic.  Mouth/Throat: Oropharynx is clear and moist.  Eyes: Conjunctivae and EOM are normal.  Neck: Normal range of motion. Neck supple.  Cardiovascular: Normal rate, regular rhythm and normal heart sounds.   Pulmonary/Chest: Effort normal and breath sounds normal. No respiratory distress.  Abdominal: Soft. Bowel sounds are normal. She exhibits no distension. There is no rebound and no guarding.  Mild suprapubic tenderness. No peritoneal signs.  Genitourinary: Uterus normal. Cervix exhibits no motion tenderness, no discharge and no friability. Right adnexum displays no tenderness. Left adnexum displays no tenderness. There is bleeding in the vagina. No tenderness in the vagina. No vaginal discharge found.  Musculoskeletal: Normal range of motion. She exhibits no edema.  Neurological: She is alert and oriented to person, place, and time. No sensory deficit.  Skin: Skin is warm and dry.  Psychiatric: She has a normal mood and affect. Her behavior is normal.  Nursing note and vitals reviewed.   ED Course  Procedures (including critical care time) Labs Review Labs Reviewed  URINALYSIS, ROUTINE W REFLEX MICROSCOPIC (NOT AT [facility]) - Abnormal; Notable for the following:    APPearance CLOUDY (*)    Hgb urine dipstick LARGE (*)    Leukocytes, UA MODERATE (*)    All other components within normal limits  URINE MICROSCOPIC-ADD ON - Abnormal; Notable for the following:    Squamous Epithelial / LPF FEW (*)    Bacteria, UA FEW (*)  All other components within normal limits  PREGNANCY, URINE    Imaging Review US Transvaginal Non-ob  11/30/2014   CLINICAL DATA:  Acute onset of pelvic pain.  Initial encounter.  EXAM: TRANSABDOMINAL AND TRANSVAGINAL ULTRASOUND OF PELVIS  TECHNIQUE: Both transabdominal and transvaginal ultrasound examinations of the pelvis were performed. Transabdominal technique was performed for global imaging  of the pelvis including uterus, ovaries, adnexal regions, and pelvic cul-de-sac. It was necessary to proceed with endovaginal exam following the transabdominal exam to visualize the uterus and ovaries in greater detail.  COMPARISON:  None  FINDINGS: Uterus  Measurements: 7.7 x 4.7 x 5.0 cm. A 3.2 x 2.4 x 1.5 cm myometrial fibroid is noted at the uterine fundus.  Endometrium  Thickness: 1.0 cm. A small amount of fluid is noted at the lower uterine segment.  Right ovary  Measurements: 3.0 x 2.2 x 1.6 cm. Normal appearance/no adnexal mass.  Left ovary  Measurements: 4.1 x 3.6 x 4.0 cm. There is a large tubular structure filled with echogenic material, with a similarly echogenic fluid-filled focus at the left ovary measuring 3.2 cm in size. This could reflect pyosalpinx and tubo-ovarian abscess. Alternatively, a hemorrhagic cyst and associated hemorrhage extending into the fallopian tube could have a similar appearance.  Other findings  A moderate amount of free fluid is noted within the pelvis, likely physiologic in nature.  IMPRESSION: 1. Prominent tubular structure filled with echogenic material, with a similarly echogenic fluid filled focus at the left ovary measuring 3.2 cm. This could reflect pyosalpinx and tubo-ovarian abscess. Alternatively, a hemorrhagic cyst and associated hemorrhage extending into the fallopian tube could have a similar appearance. Would correlate with the patient's labs for evidence of infection, and perform follow-up pelvic ultrasound in 2 months to ensure resolution. 2. Small amount of fluid noted at the lower uterine segment, which may reflect trace blood. 3. 3.2 cm fibroid at the uterine fundus. 4. No evidence for ovarian torsion.  These results were called by telephone at the time of interpretation on 11/30/2014 at 9:57 pm to [facility] PA, who verbally acknowledged these results.   Electronically Signed   By: Roanna Raider M.D.   On: 11/30/2014 21:57   US Pelvis Complete  11/30/2014    CLINICAL DATA:  Acute onset of pelvic pain.  Initial encounter.  EXAM: TRANSABDOMINAL AND TRANSVAGINAL ULTRASOUND OF PELVIS  TECHNIQUE: Both transabdominal and transvaginal ultrasound examinations of the pelvis were performed. Transabdominal technique was performed for global imaging of the pelvis including uterus, ovaries, adnexal regions, and pelvic cul-de-sac. It was necessary to proceed with endovaginal exam following the transabdominal exam to visualize the uterus and ovaries in greater detail.  COMPARISON:  None  FINDINGS: Uterus  Measurements: 7.7 x 4.7 x 5.0 cm. A 3.2 x 2.4 x 1.5 cm myometrial fibroid is noted at the uterine fundus.  Endometrium  Thickness: 1.0 cm. A small amount of fluid is noted at the lower uterine segment.  Right ovary  Measurements: 3.0 x 2.2 x 1.6 cm. Normal appearance/no adnexal mass.  Left ovary  Measurements: 4.1 x 3.6 x 4.0 cm. There is a large tubular structure filled with echogenic material, with a similarly echogenic fluid-filled focus at the left ovary measuring 3.2 cm in size. This could reflect pyosalpinx and tubo-ovarian abscess. Alternatively, a hemorrhagic cyst and associated hemorrhage extending into the fallopian tube could have a similar appearance.  Other findings  A moderate amount of free fluid is noted within the pelvis, likely physiologic in nature.  IMPRESSION: 1. Prominent tubular structure filled with echogenic material, with a similarly echogenic fluid filled focus at the left ovary measuring 3.2 cm. This could reflect pyosalpinx and tubo-ovarian abscess. Alternatively, a hemorrhagic cyst and associated hemorrhage extending into the fallopian tube could have a similar appearance. Would correlate with the patient's labs for evidence of infection, and perform follow-up pelvic ultrasound in 2 months to ensure resolution. 2. Small amount of fluid noted at the lower uterine segment, which may reflect trace blood. 3. 3.2 cm fibroid at the uterine fundus. 4. No  evidence for ovarian torsion.  These results were called by telephone at the time of interpretation on 11/30/2014 at 9:57 pm to [facility] PA, who verbally acknowledged these results.   Electronically Signed   By: Roanna Raider M.D.   On: 11/30/2014 21:57     EKG Interpretation None      MDM   Final diagnoses:  Hemorrhagic cyst of ovary  DUB (dysfunctional uterine bleeding)   Nontoxic appearing, NAD. AF VSS. Abdomen is soft no peritoneal signs. Had Pap smear and vaginal cultures at the health department one week ago without any acute findings. Pelvic ultrasound obtained for further evaluation into the vaginal bleeding. Ultrasound results as stated above. This is more likely hemorrhagic cyst versus TOA. She has no adnexal tenderness and no fever. I spoke with OB/GYN on call Dr. Emelda Fear who took a look at the ultrasound and states this has an appearance more like a hemorrhagic cyst. States the patient will need to follow-up with OB/GYN and have a repeat ultrasound in about 6 weeks. Discussed this with patient. Resources given for OB/GYN follow-up. Stable for discharge. Return precautions given. Patient states understanding of treatment care plan and is agreeable.  Discussed with attending Dr. Fredderick Phenix who agrees with plan of care.   Kathrynn Speed, PA-C 11/30/14 2217  Rolan Bucco, MD 11/30/14 [phone]

## 2014-11-30 NOTE — Discharge Instructions (Signed)
You will need to follow up at [facility] outpatient clinic or another OB/GYN. You will need another ultrasound in 6 weeks.  Ovarian Cyst An ovarian cyst is a fluid-filled sac that forms on an ovary. The ovaries are small organs that produce eggs in women. Various types of cysts can form on the ovaries. Most are not cancerous. Many do not cause problems, and they often go away on their own. Some may cause symptoms and require treatment. Common types of ovarian cysts include:  Functional cysts--These cysts may occur every month during the menstrual cycle. This is normal. The cysts usually go away with the next menstrual cycle if the woman does not get pregnant. Usually, there are no symptoms with a functional cyst.  Endometrioma cysts--These cysts form from the tissue that lines the uterus. They are also called "chocolate cysts" because they become filled with blood that turns brown. This type of cyst can cause pain in the lower abdomen during intercourse and with your menstrual period.  Cystadenoma cysts--This type develops from the cells on the outside of the ovary. These cysts can get very big and cause lower abdomen pain and pain with intercourse. This type of cyst can twist on itself, cut off its blood supply, and cause severe pain. It can also easily rupture and cause a lot of pain.  Dermoid cysts--This type of cyst is sometimes found in both ovaries. These cysts may contain different kinds of body tissue, such as skin, teeth, hair, or cartilage. They usually do not cause symptoms unless they get very big.  Theca lutein cysts--These cysts occur when too much of a certain hormone (human chorionic gonadotropin) is produced and overstimulates the ovaries to produce an egg. This is most common after procedures used to assist with the conception of a baby (in vitro fertilization). CAUSES   Fertility drugs can cause a condition in which multiple large cysts are formed on the ovaries. This is called  ovarian hyperstimulation syndrome.  A condition called polycystic ovary syndrome can cause hormonal imbalances that can lead to nonfunctional ovarian cysts. SIGNS AND SYMPTOMS  Many ovarian cysts do not cause symptoms. If symptoms are present, they may include:  Pelvic pain or pressure.  Pain in the lower abdomen.  Pain during sexual intercourse.  Increasing girth (swelling) of the abdomen.  Abnormal menstrual periods.  Increasing pain with menstrual periods.  Stopping having menstrual periods without being pregnant. DIAGNOSIS  These cysts are commonly found during a routine or annual pelvic exam. Tests may be ordered to find out more about the cyst. These tests may include:  Ultrasound.  X-ray of the pelvis.  CT scan.  MRI.  Blood tests. TREATMENT  Many ovarian cysts go away on their own without treatment. Your health care provider may want to check your cyst regularly for 2-3 months to see if it changes. For women in menopause, it is particularly important to monitor a cyst closely because of the higher rate of ovarian cancer in menopausal women. When treatment is needed, it may include any of the following:  A procedure to drain the cyst (aspiration). This may be done using a long needle and ultrasound. It can also be done through a laparoscopic procedure. This involves using a thin, lighted tube with a tiny camera on the end (laparoscope) inserted through a small incision.  Surgery to remove the whole cyst. This may be done using laparoscopic surgery or an open surgery involving a larger incision in the lower abdomen.  Hormone treatment  or birth control pills. These methods are sometimes used to help dissolve a cyst. HOME CARE INSTRUCTIONS   Only take over-the-counter or prescription medicines as directed by your health care provider.  Follow up with your health care provider as directed.  Get regular pelvic exams and Pap tests. SEEK MEDICAL CARE IF:   Your periods  are late, irregular, or painful, or they stop.  Your pelvic pain or abdominal pain does not go away.  Your abdomen becomes larger or swollen.  You have pressure on your bladder or trouble emptying your bladder completely.  You have pain during sexual intercourse.  You have feelings of fullness, pressure, or discomfort in your stomach.  You lose weight for no apparent reason.  You feel generally ill.  You become constipated.  You lose your appetite.  You develop acne.  You have an increase in body and facial hair.  You are gaining weight, without changing your exercise and eating habits.  You think you are pregnant. SEEK IMMEDIATE MEDICAL CARE IF:   You have increasing abdominal pain.  You feel sick to your stomach (nauseous), and you throw up (vomit).  You develop a fever that comes on suddenly.  You have abdominal pain during a bowel movement.  Your menstrual periods become heavier than usual. MAKE SURE YOU:  Understand these instructions.  Will watch your condition.  Will get help right away if you are not doing well or get worse. Document Released: 05/27/2005 Document Revised: 06/01/2013 Document Reviewed: 02/01/2013 [facility] Patient Information 2015 [place], [place]. This information is not intended to replace advice given to you by your health care provider. Make sure you discuss any questions you have with your health care provider.  Dysfunctional Uterine Bleeding Normally, menstrual periods begin between ages 92 to 5 in young women. A normal menstrual cycle/period may begin every 23 days up to 35 days and lasts from 1 to 7 days. Around 12 to 14 days before your menstrual period starts, ovulation (ovary produces an egg) occurs. When counting the time between menstrual periods, count from the first day of bleeding of the previous period to the first day of bleeding of the next period. Dysfunctional (abnormal) uterine bleeding is bleeding that is different from a  normal menstrual period. Your periods may come earlier or later than usual. They may be lighter, have blood clots or be heavier. You may have bleeding between periods, or you may skip one period or more. You may have bleeding after sexual intercourse, bleeding after menopause, or no menstrual period. CAUSES   Pregnancy (normal, miscarriage, tubal).  IUDs (intrauterine device, birth control).  Birth control pills.  Hormone treatment.  Menopause.  Infection of the cervix.  Blood clotting problems.  Infection of the inside lining of the uterus.  Endometriosis, inside lining of the uterus growing in the pelvis and other female organs.  Adhesions (scar tissue) inside the uterus.  Obesity or severe weight loss.  Uterine polyps inside the uterus.  Cancer of the vagina, cervix, or uterus.  Ovarian cysts or polycystic ovary syndrome.  Medical problems (diabetes, thyroid disease).  Uterine fibroids (noncancerous tumor).  Problems with your female hormones.  Endometrial hyperplasia, very thick lining and enlarged cells inside of the uterus.  Medicines that interfere with ovulation.  Radiation to the pelvis or abdomen.  Chemotherapy. DIAGNOSIS   Your doctor will discuss the history of your menstrual periods, medicines you are taking, changes in your weight, stress in your life, and any medical problems you may  have.  Your doctor will do a physical and pelvic examination.  Your doctor may want to perform certain tests to make a diagnosis, such as:  Pap test.  Blood tests.  Cultures for infection.  CT scan.  Ultrasound.  Hysteroscopy.  Laparoscopy.  MRI.  Hysterosalpingography.  D and C.  Endometrial biopsy. TREATMENT  Treatment will depend on the cause of the dysfunctional uterine bleeding (DUB). Treatment may include:  Observing your menstrual periods for a couple of months.  Prescribing medicines for medical problems,  including:  Antibiotics.  Hormones.  Birth control pills.  Removing an IUD (intrauterine device, birth control).  Surgery:  D and C (scrape and remove tissue from inside the uterus).  Laparoscopy (examine inside the abdomen with a lighted tube).  Uterine ablation (destroy lining of the uterus with electrical current, laser, heat, or freezing).  Hysteroscopy (examine cervix and uterus with a lighted tube).  Hysterectomy (remove the uterus). HOME CARE INSTRUCTIONS   If medicines were prescribed, take exactly as directed. Do not change or switch medicines without consulting your caregiver.  Long term heavy bleeding may result in iron deficiency. Your caregiver may have prescribed iron pills. They help replace the iron that your body lost from heavy bleeding. Take exactly as directed.  Do not take aspirin or medicines that contain aspirin one week before or during your menstrual period. Aspirin may make the bleeding worse.  If you need to change your sanitary pad or tampon more than once every 2 hours, stay in bed with your feet elevated and a cold pack on your lower abdomen. Rest as much as possible, until the bleeding stops or slows down.  Eat well-balanced meals. Eat foods high in iron. Examples are:  Leafy green vegetables.  Whole-grain breads and cereals.  Eggs.  Meat.  Liver.  Do not try to lose weight until the abnormal bleeding has stopped and your blood iron level is back to normal. Do not lift more than ten pounds or do strenuous activities when you are bleeding.  For a couple of months, make note on your calendar, marking the start and ending of your period, and the type of bleeding (light, medium, heavy, spotting, clots or missed periods). This is for your caregiver to better evaluate your problem. SEEK MEDICAL CARE IF:   You develop nausea (feeling sick to your stomach) and vomiting, dizziness, or diarrhea while you are taking your medicine.  You are getting  lightheaded or weak.  You have any problems that may be related to the medicine you are taking.  You develop pain with your DUB.  You want to remove your IUD.  You want to stop or change your birth control pills or hormones.  You have any type of abnormal bleeding mentioned above.  You are over 80 years old and have not had a menstrual period yet.  You are 39 years old and you are still having menstrual periods.  You have any of the symptoms mentioned above.  You develop a rash. SEEK IMMEDIATE MEDICAL CARE IF:   An oral temperature above 102 F (38.9 C) develops.  You develop chills.  You are changing your sanitary pad or tampon more than once an hour.  You develop abdominal pain.  You pass out or faint. Document Released: 05/24/2000 Document Revised: 08/19/2011 Document Reviewed: 04/25/2009 [facility] Patient Information 2015 [place], [place]. This information is not intended to replace advice given to you by your health care provider. Make sure you discuss any questions you  have with your health care provider. ° °

## 2014-11-30 NOTE — ED Notes (Signed)
C/o abd pain and bloating since Friday-vaginal bleeding intermittent since last depo injection since 6/15

## 2014-11-30 NOTE — ED Notes (Signed)
Patient transported to Ultrasound 

## 2014-12-05 ENCOUNTER — Telehealth: Payer: Self-pay

## 2014-12-05 DIAGNOSIS — N938 Other specified abnormal uterine and vaginal bleeding: Secondary | ICD-10-CM

## 2014-12-05 NOTE — Telephone Encounter (Deleted)
-----   Message from Vivien Rota sent at 12/01/2014 10:14 AM EDT ----- Patient needs an Ultrasound done in six weeks, then to f/u with a provider.  Thanks, [employer]

## 2014-12-05 NOTE — Telephone Encounter (Signed)
[facility] needs [facility]/S done in 6 weeks then f/u with a provider. U/S scheduled for 01/11/15 a 0900. Called patient and informed her of recommendations, U/S appointment and that she will be contacted by front office staff for appointment after the 3rd. Verbalized understanding. Message sent to admin pool to schedule clinic f/u after 8/3.

## 2015-01-11 ENCOUNTER — Ambulatory Visit (HOSPITAL_COMMUNITY): Admission: RE | Admit: 2015-01-11 | Payer: Medicaid Other | Source: Ambulatory Visit

## 2015-01-18 ENCOUNTER — Ambulatory Visit: Payer: Medicaid Other | Admitting: Obstetrics & Gynecology

## 2015-08-30 ENCOUNTER — Emergency Department (HOSPITAL_BASED_OUTPATIENT_CLINIC_OR_DEPARTMENT_OTHER)
Admission: EM | Admit: 2015-08-30 | Discharge: 2015-08-30 | Disposition: A | Discharge status: Home / Self Care | Payer: Medicaid Other | Attending: Emergency Medicine | Admitting: Emergency Medicine

## 2015-08-30 ENCOUNTER — Encounter (HOSPITAL_BASED_OUTPATIENT_CLINIC_OR_DEPARTMENT_OTHER): Payer: Self-pay | Admitting: Emergency Medicine

## 2015-08-30 DIAGNOSIS — J111 Influenza due to unidentified influenza virus with other respiratory manifestations: Secondary | ICD-10-CM | POA: Insufficient documentation

## 2015-08-30 DIAGNOSIS — R69 Illness, unspecified: Secondary | ICD-10-CM

## 2015-08-30 DIAGNOSIS — Z793 Long term (current) use of hormonal contraceptives: Secondary | ICD-10-CM | POA: Insufficient documentation

## 2015-08-30 MED ORDER — OSELTAMIVIR PHOSPHATE 75 MG PO CAPS: 75.0000 mg | ORAL_CAPSULE | Freq: Two times a day (BID) | ORAL | Status: DC

## 2015-08-30 MED FILL — TAMIFLU 75 MG GELCAP: 75 | 5 days supply | Qty: 10 | Fill #0

## 2015-08-30 NOTE — ED Notes (Signed)
Upper respiratory infection symptoms for two days.  Family recently had the flu and strep throat.

## 2015-08-30 NOTE — ED Provider Notes (Signed)
CSN: [id number]     Arrival date & time 08/30/15  [id number] History   First MD Initiated Contact with Patient 08/30/15 0935     Chief Complaint  Patient presents with  . URI     (Consider location/radiation/quality/duration/timing/severity/associated sxs/prior Treatment) HPI  Pt presenting with c/o cough and URI symptoms.  Her symptoms began 2 days ago.  She states she has had children who were recently diagnosed with the flu.  One child also had strep throat.  Pt has no significant sore throat.  Cough is nonproductive.  Does have some muscle aches associated.  Has had subjective fever.  No vomiting or diarrhea.  There are no other associated systemic symptoms, there are no other alleviating or modifying factors.   She has not had any treatment prior to arrival.    No past medical history on file. No past surgical history on file. No family history on file. Social History  Substance Use Topics  . Smoking status: Never Smoker   . Smokeless tobacco: None  . Alcohol Use: No   OB History    No data available     Review of Systems  ROS reviewed and all otherwise negative except for mentioned in HPI    Allergies  Review of patient's allergies indicates no known allergies.  Home Medications   Prior to Admission medications   Medication Sig Start Date End Date Taking? Authorizing Provider  norgestimate-ethinyl estradiol (ORTHO-CYCLEN,SPRINTEC,PREVIFEM) 0.25-35 MG-MCG tablet Take 1 tablet by mouth daily.   Yes Historical Provider, MD  oseltamivir (TAMIFLU) 75 MG capsule Take 1 capsule (75 mg total) by mouth every 12 (twelve) hours. 08/30/15   Jerelyn ScottMartha Linker, MD   BP 126/80 mmHg  Pulse 90  Temp(Src) 98.2 F (36.8 C) (Oral)  Resp 16  Ht 5' 4.5" (1.638 m)  Wt 169 lb (76.658 kg)  BMI 28.57 kg/m2  SpO2 98%  Vitals reviewed Physical Exam  Physical Examination: General appearance - alert, well appearing, and in no distress Mental status - alert, oriented to person, place, and time Eyes  - no conjunctival injection, no scleral icterus Neck - supple, no significant adenopathy Mouth- OP clear, no significant erythema, palate symmetric, uvula midline Chest - clear to auscultation, no wheezes, rales or rhonchi, symmetric air entry Heart - normal rate, regular rhythm, normal S1, S2, no murmurs, rubs, clicks or gallops Neurological - alert, oriented, normal speech Extremities - peripheral pulses normal, no pedal edema, no clubbing or cyanosis Skin - normal coloration and turgor, no rashes  ED Course  Procedures (including critical care time) Labs Review Labs Reviewed - No data to display  Imaging Review No results found. I have personally reviewed and evaluated these images and lab results as part of my medical decision-making.   EKG Interpretation None      MDM   Final diagnoses:  Influenza-like illness    Pt presenting with c/o URI symptoms with cough and body aches for the past 2 days.  Has been exposed to flu in her home.   Patient is overall nontoxic and well hydrated in appearance.  Pt will be treated with tamiflu to help her symptoms of likely influenza.  Also recommended symptomatic care. Discharged with strict return precautions.  Pt agreeable with plan.     Jerelyn ScottMartha Linker, MD 08/30/15 [id number]

## 2015-08-30 NOTE — Discharge Instructions (Signed)
Return to the ED with any concerns including difficulty breathing, vomiting and not able to keep down liquids, decreased urine output, decreased level of alertness/lethargy, or any other alarming symptoms  °

## 2015-12-25 IMAGING — CR DG CHEST 2V
2 series · 2 of 2 positions shown · non-contrast
Comparison: None.

CLINICAL DATA: Mid chest pain for 3 days.

EXAM:
CHEST  2 VIEW

[w chest pa]
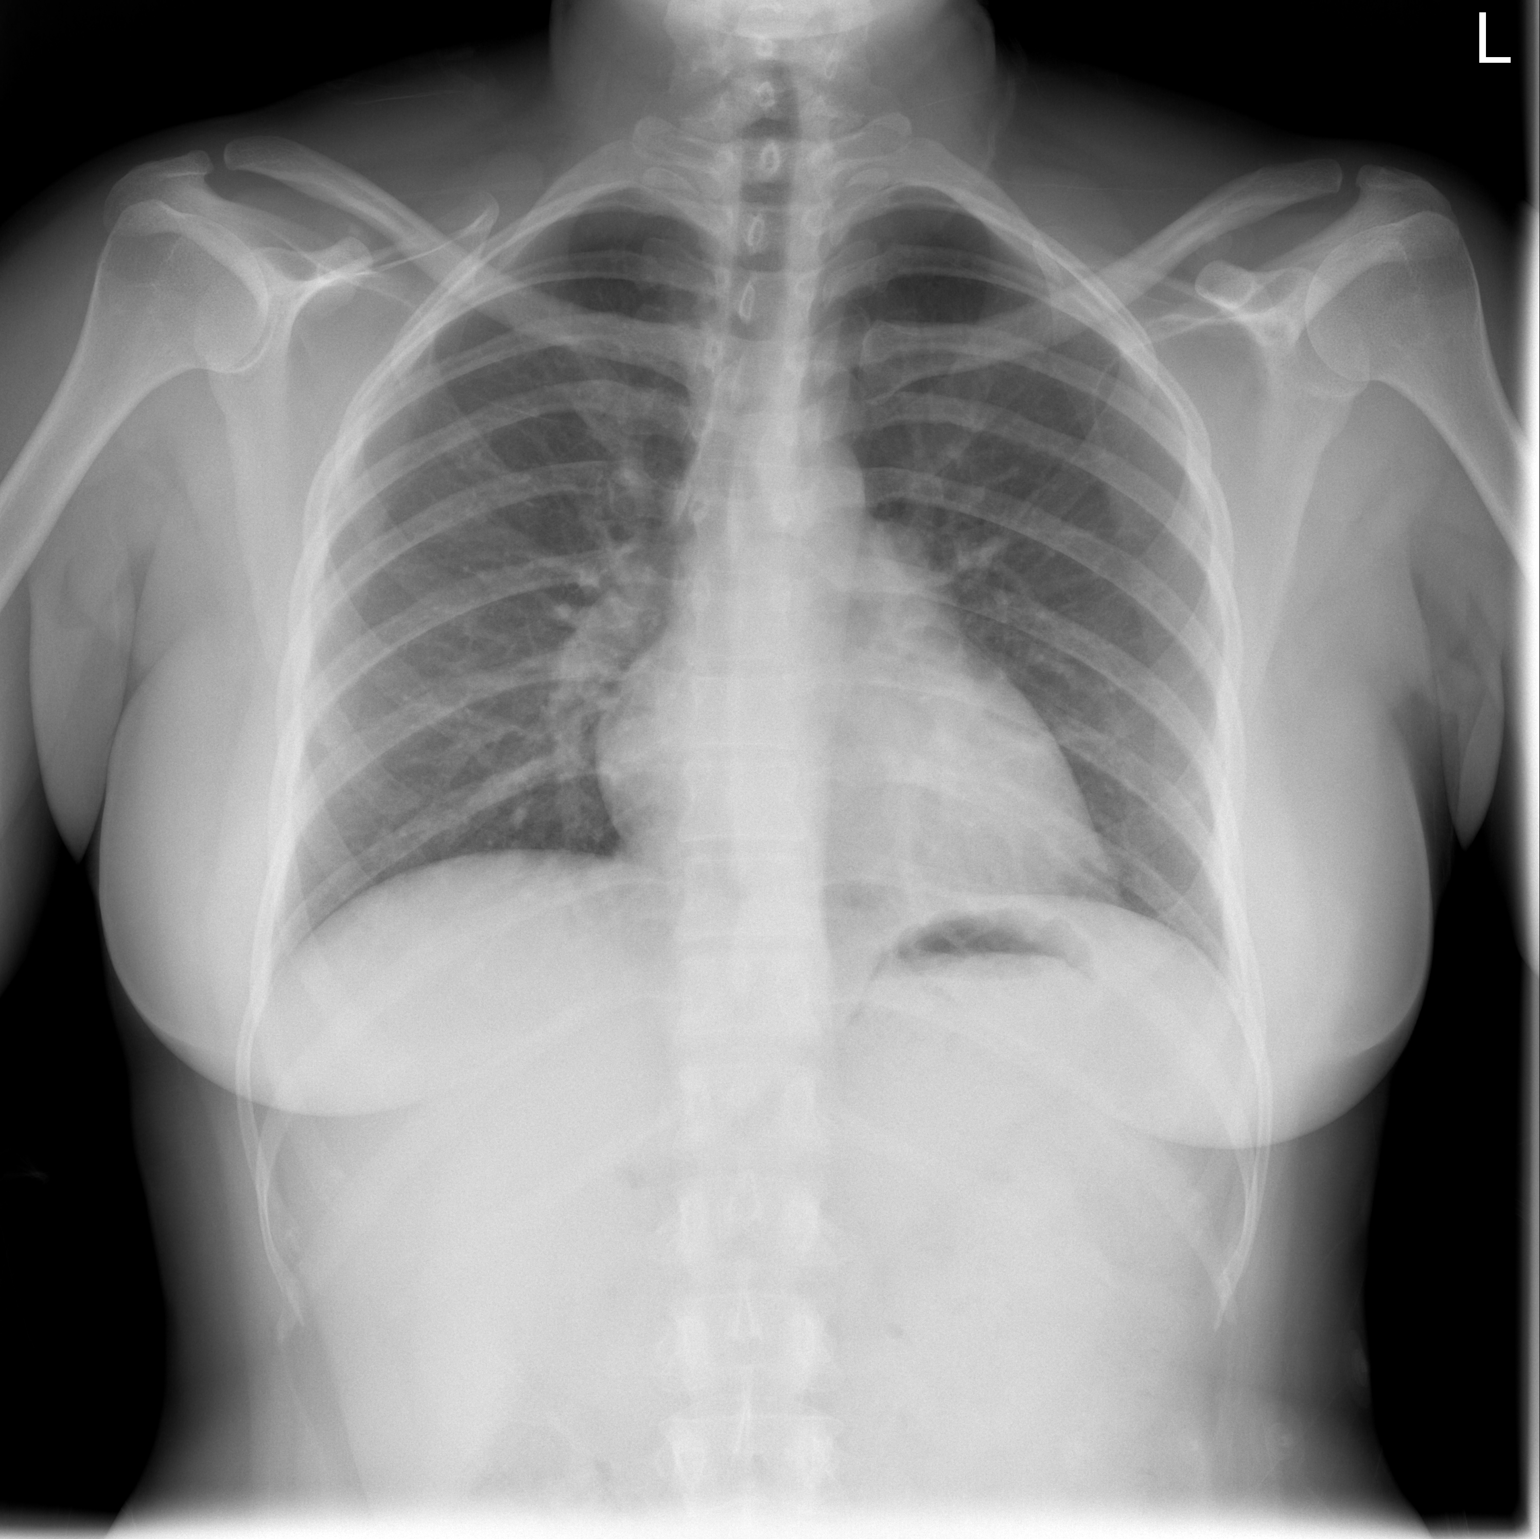

[w chest lat]
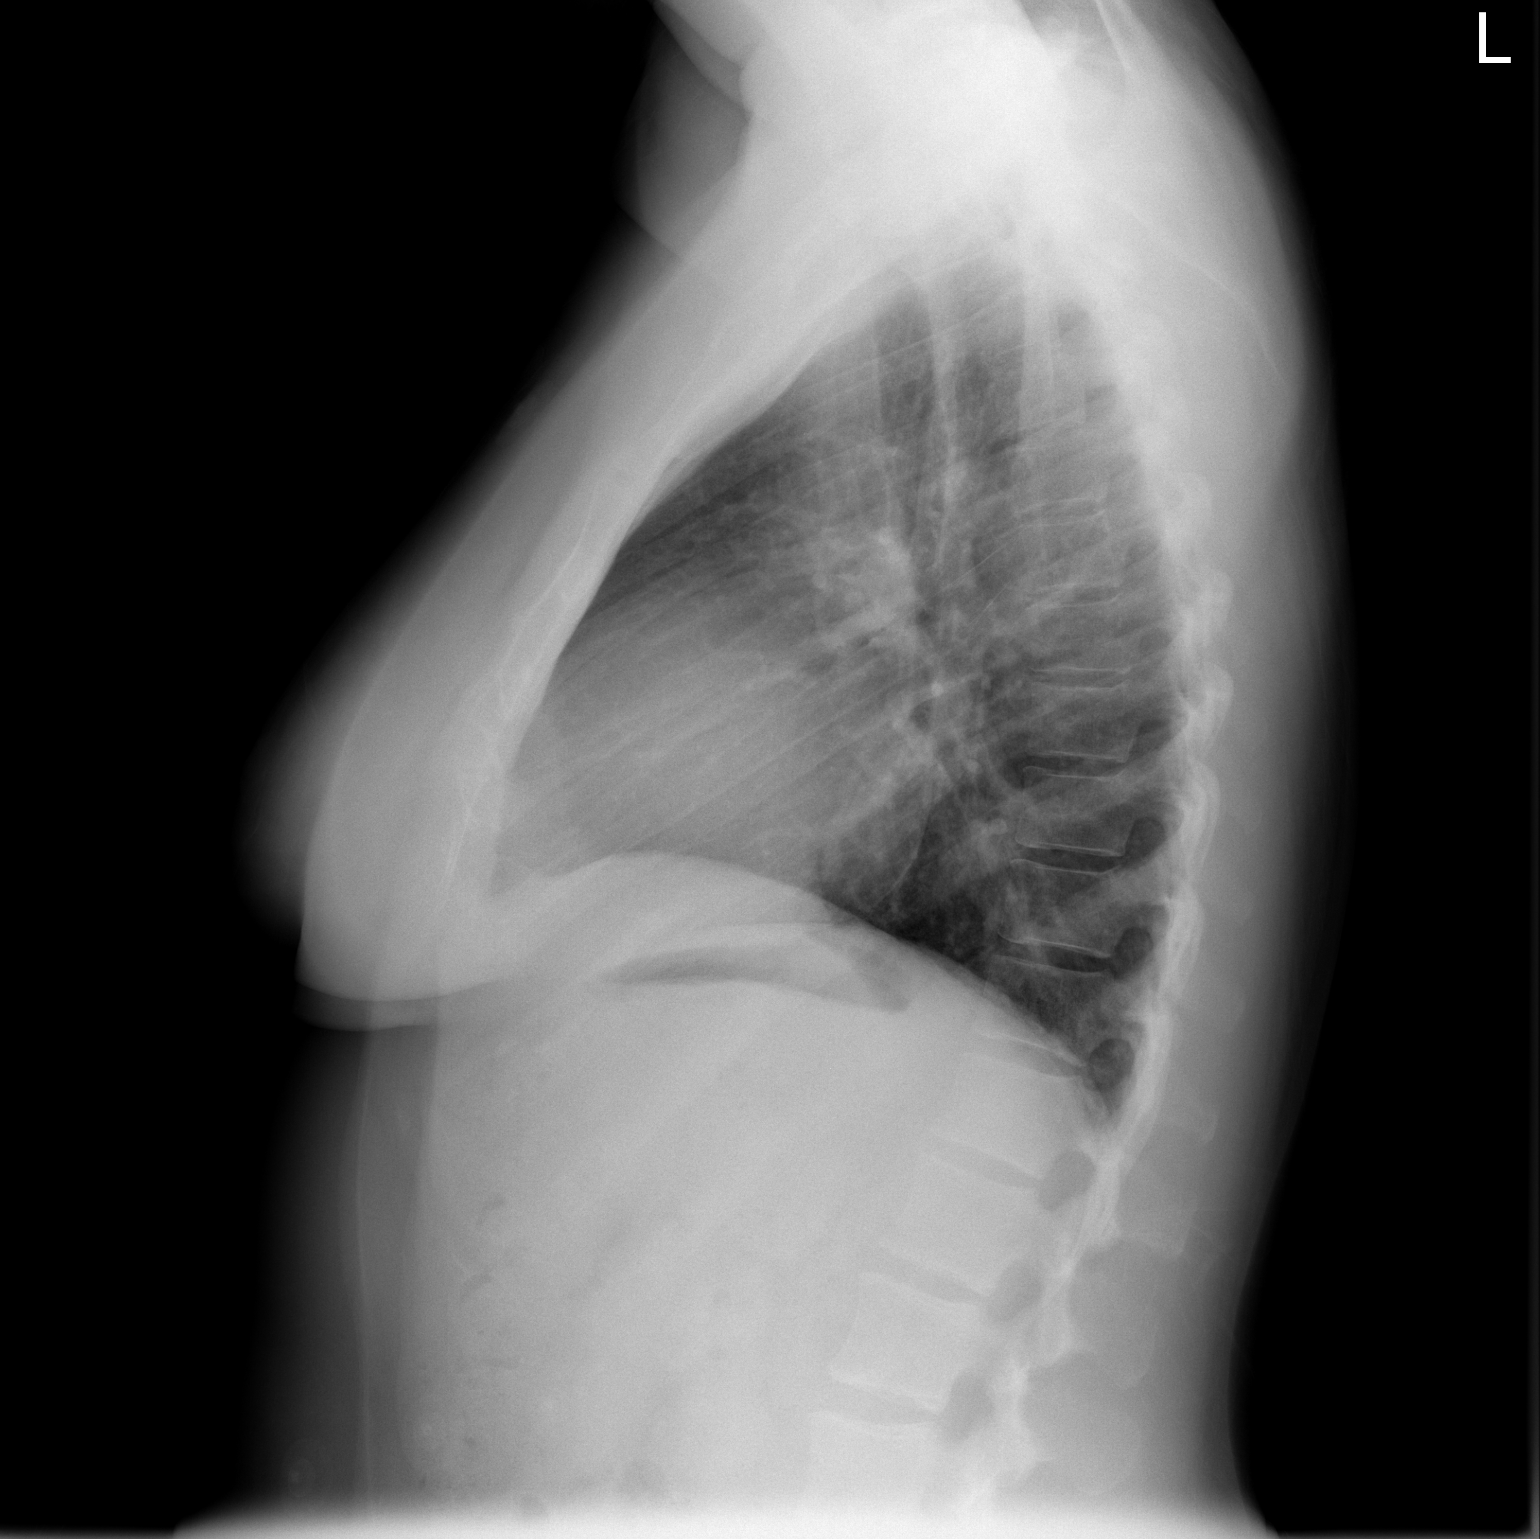

[2 of 2 positions shown; findings below may reference images not displayed]

FINDINGS: Normal heart size and mediastinal contours. No acute infiltrate or
edema. No effusion or pneumothorax. No acute osseous findings.
IMPRESSION: Negative chest.

## 2016-03-19 IMAGING — US US TRANSVAGINAL NON-OB
1 series · 13 of 25 positions shown · non-contrast
Comparison: None

CLINICAL DATA: Acute onset of pelvic pain.  Initial encounter.

EXAM:
TRANSABDOMINAL AND TRANSVAGINAL ULTRASOUND OF PELVIS
TECHNIQUE: Both transabdominal and transvaginal ultrasound examinations of the
pelvis were performed. Transabdominal technique was performed for
global imaging of the pelvis including uterus, ovaries, adnexal
regions, and pelvic cul-de-sac. It was necessary to proceed with
endovaginal exam following the transabdominal exam to visualize the
uterus and ovaries in greater detail.

[Series 1: us transvaginal non-ob · 0.24mm/px · 13 of 56 slices shown]
[im 1/56]
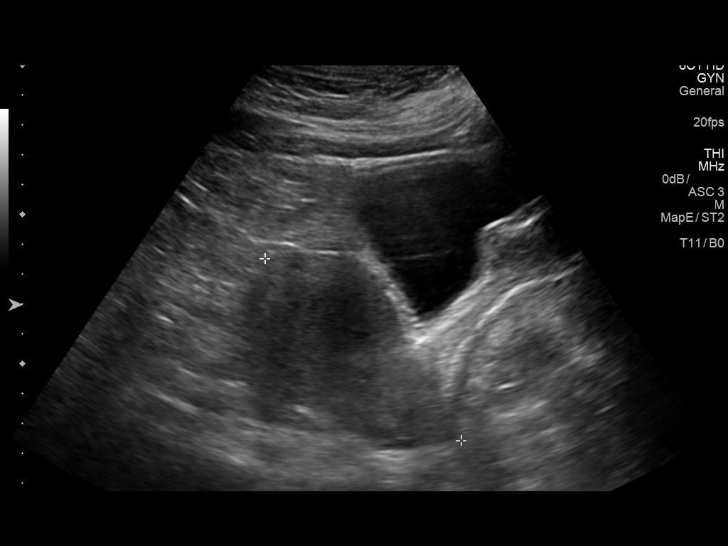
[im 5/56]
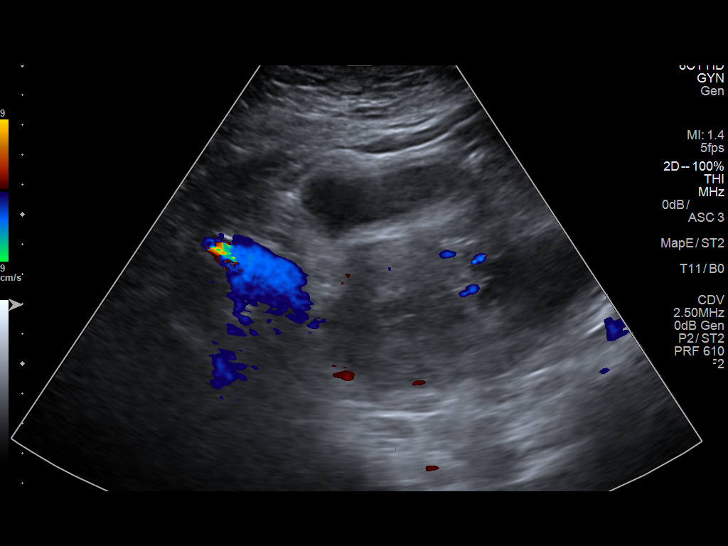
[im 10/56]
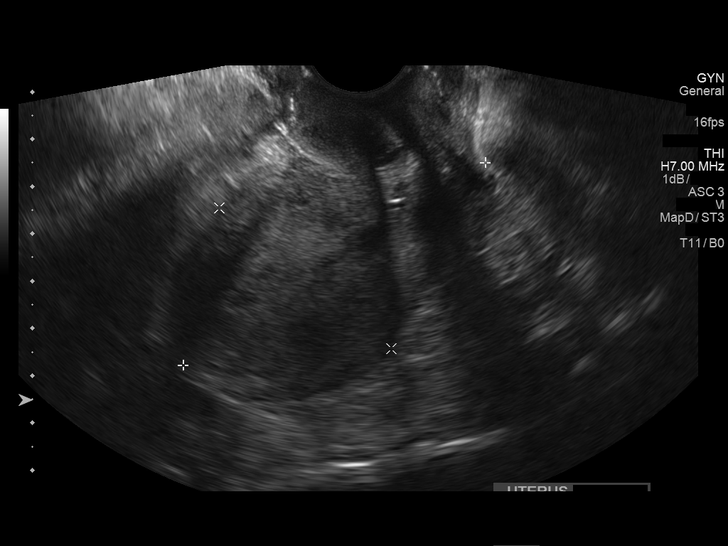
[im 14/56]
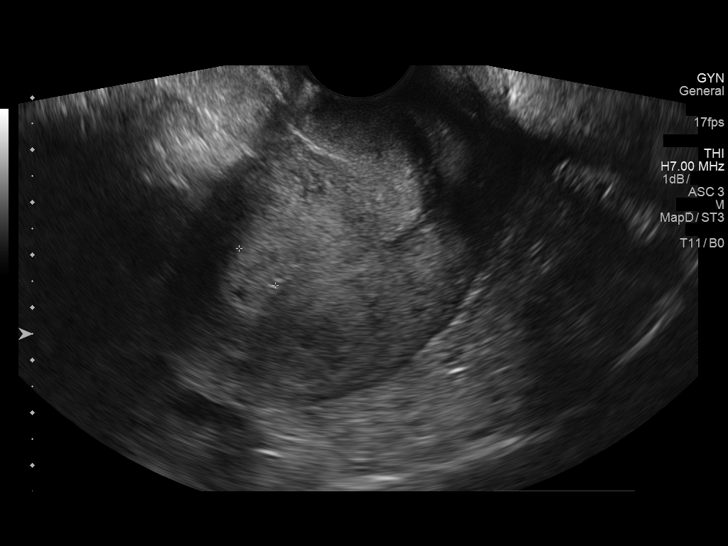
[im 19/56]
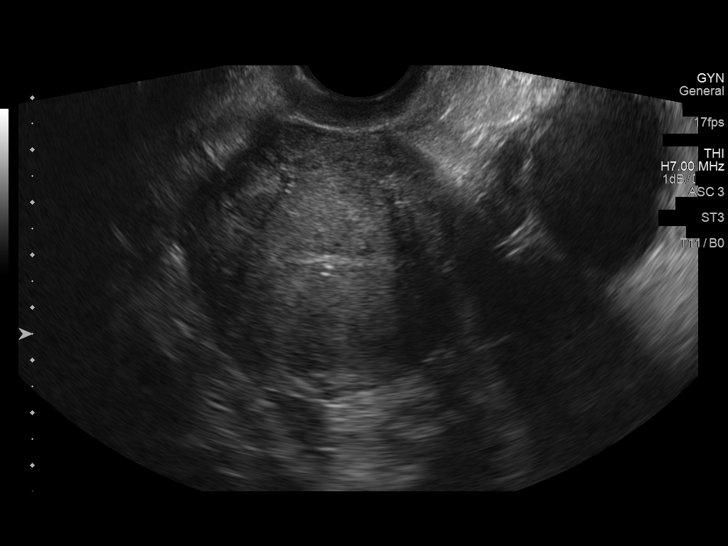
[im 23/56]
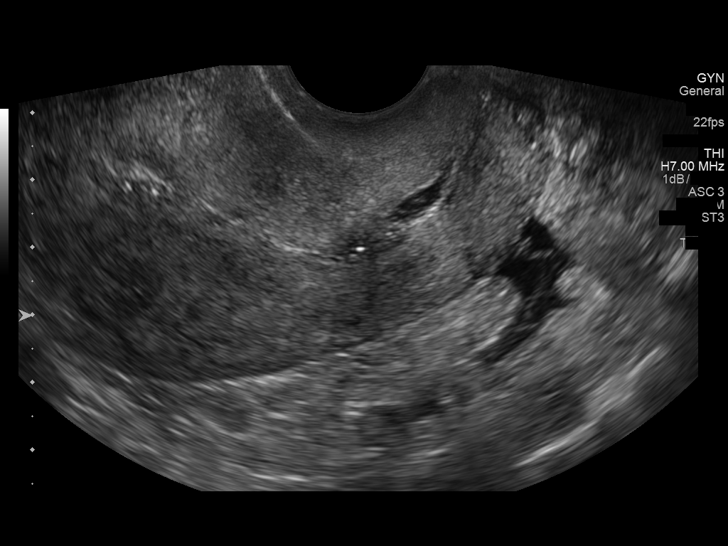
[im 28/56]
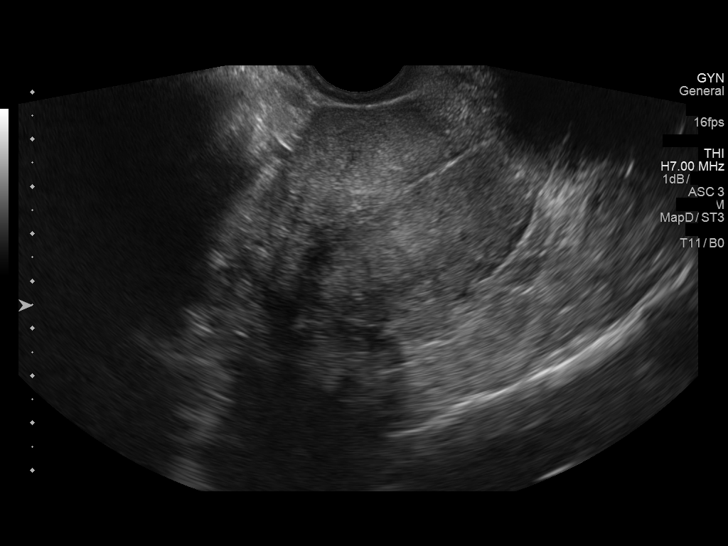
[im 33/56]
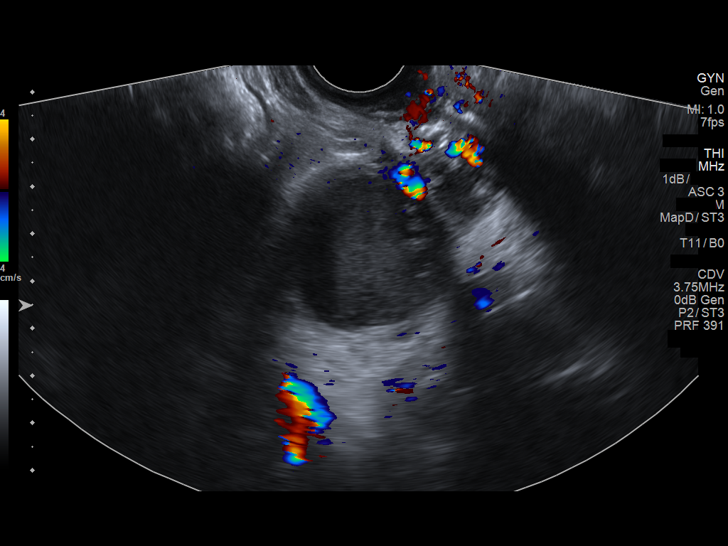
[im 37/56]
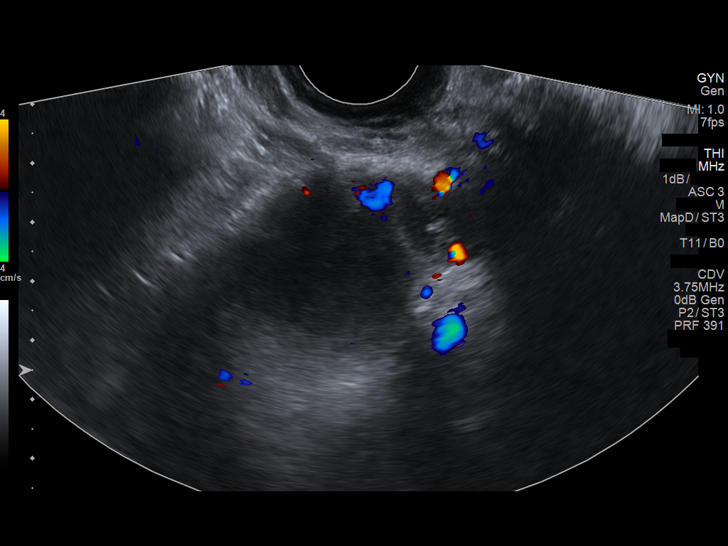
[im 42/56]
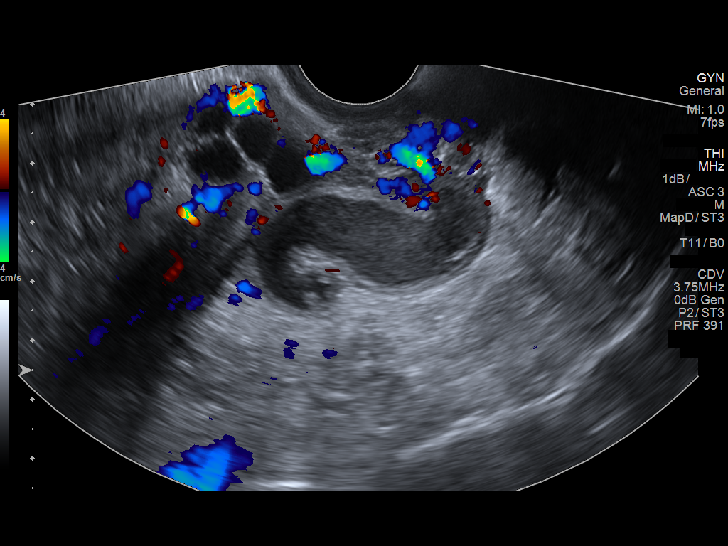
[im 46/56]
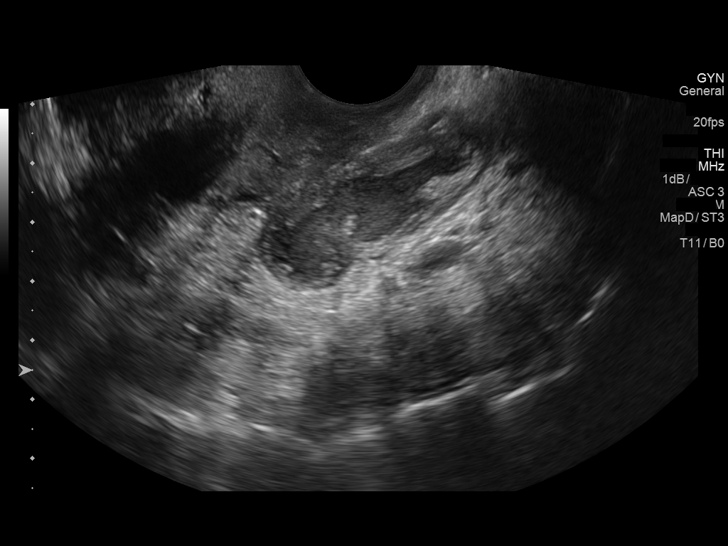
[im 51/56]
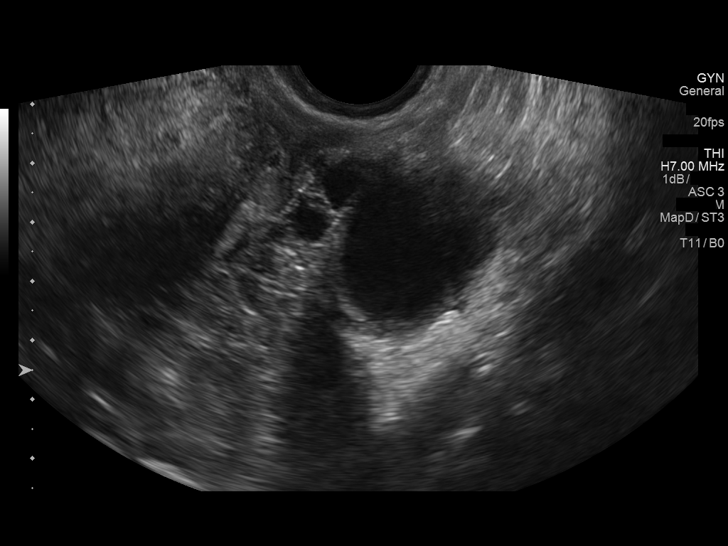
[im 56/56]
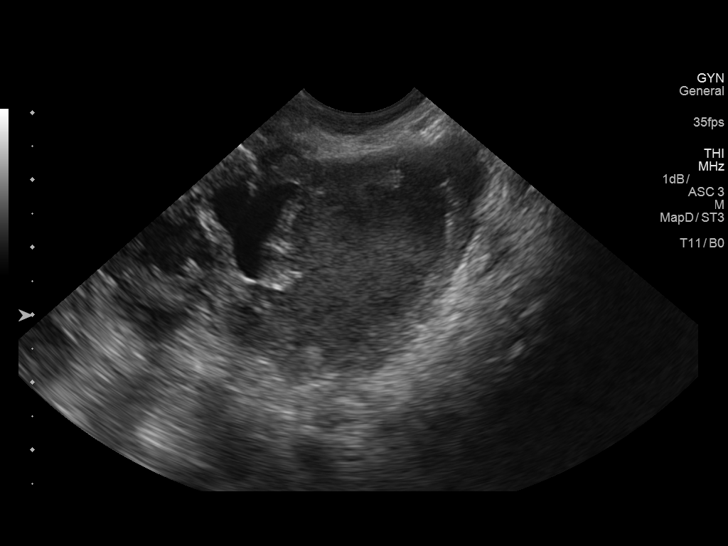

[13 of 25 positions shown; findings below may reference images not displayed]

FINDINGS: Uterus

Measurements: 7.7 x 4.7 x 5.0 cm. A 3.2 x 2.4 x 1.5 cm myometrial
fibroid is noted at the uterine fundus.

Endometrium

Thickness: 1.0 cm. A small amount of fluid is noted at the lower
uterine segment.

Right ovary

Measurements: 3.0 x 2.2 x 1.6 cm. Normal appearance/no adnexal mass.

Left ovary

Measurements: 4.1 x 3.6 x 4.0 cm. There is a large tubular structure
filled with echogenic material, with a similarly echogenic
fluid-filled focus at the left ovary measuring 3.2 cm in size. This
could reflect pyosalpinx and tubo-ovarian abscess. Alternatively, a
hemorrhagic cyst and associated hemorrhage extending into the
fallopian tube could have a similar appearance.

Other findings

A moderate amount of free fluid is noted within the pelvis, likely
physiologic in nature.
IMPRESSION: 1. Prominent tubular structure filled with echogenic material, with
a similarly echogenic fluid filled focus at the left ovary measuring
3.2 cm. This could reflect pyosalpinx and tubo-ovarian abscess.
Alternatively, a hemorrhagic cyst and associated hemorrhage
extending into the fallopian tube could have a similar appearance.
Would correlate with the patient's labs for evidence of infection,
and perform follow-up pelvic ultrasound in 2 months to ensure
resolution.
2. Small amount of fluid noted at the lower uterine segment, which
may reflect trace blood.
3. 3.2 cm fibroid at the uterine fundus.
4. No evidence for ovarian torsion.

These results were called by telephone at the time of interpretation
on 11/30/2014 at [DATE] to NHU HOLLINS PA, who verbally acknowledged
these results.

## 2016-07-15 ENCOUNTER — Encounter (HOSPITAL_BASED_OUTPATIENT_CLINIC_OR_DEPARTMENT_OTHER): Payer: Self-pay | Admitting: *Deleted

## 2016-07-15 ENCOUNTER — Emergency Department (HOSPITAL_BASED_OUTPATIENT_CLINIC_OR_DEPARTMENT_OTHER)
Admission: EM | Admit: 2016-07-15 | Discharge: 2016-07-15 | Disposition: A | Discharge status: Home / Self Care | Payer: Medicaid Other | Attending: Emergency Medicine | Admitting: Emergency Medicine

## 2016-07-15 ENCOUNTER — Emergency Department (HOSPITAL_BASED_OUTPATIENT_CLINIC_OR_DEPARTMENT_OTHER): Payer: Medicaid Other

## 2016-07-15 DIAGNOSIS — J029 Acute pharyngitis, unspecified: Secondary | ICD-10-CM | POA: Insufficient documentation

## 2016-07-15 DIAGNOSIS — R69 Illness, unspecified: Secondary | ICD-10-CM

## 2016-07-15 DIAGNOSIS — R112 Nausea with vomiting, unspecified: Secondary | ICD-10-CM | POA: Insufficient documentation

## 2016-07-15 DIAGNOSIS — R6883 Chills (without fever): Secondary | ICD-10-CM | POA: Insufficient documentation

## 2016-07-15 DIAGNOSIS — R05 Cough: Secondary | ICD-10-CM | POA: Diagnosis not present

## 2016-07-15 DIAGNOSIS — R51 Headache: Secondary | ICD-10-CM | POA: Insufficient documentation

## 2016-07-15 DIAGNOSIS — M791 Myalgia: Secondary | ICD-10-CM | POA: Insufficient documentation

## 2016-07-15 DIAGNOSIS — J111 Influenza due to unidentified influenza virus with other respiratory manifestations: Secondary | ICD-10-CM

## 2016-07-15 MED ORDER — HYDROCODONE-ACETAMINOPHEN 5-325 MG PO TABS: ORAL_TABLET | ORAL | 0 refills | Status: AC

## 2016-07-15 MED ORDER — OSELTAMIVIR PHOSPHATE 75 MG PO CAPS
75.0000 mg | ORAL_CAPSULE | Freq: Once | ORAL | Status: AC
  Administered 2016-07-15: 75 mg via ORAL
  Filled 2016-07-15: qty 1

## 2016-07-15 MED ORDER — ONDANSETRON HCL 4 MG PO TABS: 4.0000 mg | ORAL_TABLET | Freq: Three times a day (TID) | ORAL | 0 refills | Status: AC | PRN

## 2016-07-15 MED ORDER — LIDOCAINE VISCOUS 2 % MT SOLN
20.0000 mL | Freq: Once | OROMUCOSAL | Status: AC
  Administered 2016-07-15: 20 mL via OROMUCOSAL
  Filled 2016-07-15: qty 30

## 2016-07-15 MED ORDER — OSELTAMIVIR PHOSPHATE 75 MG PO CAPS: 75.0000 mg | ORAL_CAPSULE | Freq: Two times a day (BID) | ORAL | 0 refills | Status: DC

## 2016-07-15 MED FILL — HYDROCODON-APAP 5-325: 5-325 | 2 days supply | Qty: 13 | Fill #0

## 2016-07-15 MED FILL — ONDANSETRON HCL 4 MG TABLET: 4 | 3 days supply | Qty: 11 | Fill #0

## 2016-07-15 MED FILL — TAMIFLU 75 MG GELCAP: 75 | 5 days supply | Qty: 10 | Fill #0

## 2016-07-15 NOTE — Discharge Instructions (Signed)

## 2016-07-15 NOTE — ED Provider Notes (Signed)
[facility]   CSN: [id number] Arrival date & time: 07/15/16  0845     History   Chief Complaint Chief Complaint  Patient presents with  . Cough      HPI   Blood pressure 121/81, pulse 88, temperature 98.2 F (36.8 C), resp. rate 16, height 5' 4.5" (1.638 m), weight 79.4 kg, last menstrual period 06/28/2016, SpO2 100 %.  Christy Carroll is a 41 y.o. female with no significant past medical history, nonsmoker complaining of nausea and vomiting onset 3 days ago, this is past that since that time she's had rhinorrhea, dry cough, sore throat, headache, myalgia and chills. She has positive sick contact and she had a coworker with the flu that she was exposed to several days ago. Having any medication at home except for TheraFlu which she finds moderately helpful. She denies cervicalgia, chest pain, shortness of breath, abdominal pain, change in bowel or bladder habits or persistent nausea and vomiting.  History reviewed. No pertinent past medical history.  There are no active problems to display for this patient.   History reviewed. No pertinent surgical history.  OB History    No data available       Home Medications    Prior to Admission medications   Medication Sig Start Date End Date Taking? Authorizing Provider  HYDROcodone-acetaminophen (NORCO/VICODIN) 5-325 MG tablet Take 1-2 tablets by mouth every 6 hours as needed for pain. 07/15/16   Ashlynne Shetterly, PA-C  norgestimate-ethinyl estradiol (ORTHO-CYCLEN,SPRINTEC,PREVIFEM) 0.25-35 MG-MCG tablet Take 1 tablet by mouth daily.    Historical Provider, MD  ondansetron (ZOFRAN) 4 MG tablet Take 1 tablet (4 mg total) by mouth every 8 (eight) hours as needed for nausea or vomiting. 07/15/16   Joni ReiningNicole Greig Altergott, PA-C  oseltamivir (TAMIFLU) 75 MG capsule Take 1 capsule (75 mg total) by mouth every 12 (twelve) hours. 07/15/16   Wynetta EmeryNicole Athens Lebeau, PA-C    Family History History reviewed. No pertinent family  history.  Social History Social History  Substance Use Topics  . Smoking status: Never Smoker  . Smokeless tobacco: Never Used  . Alcohol use No     Allergies   Patient has no known allergies.   Review of Systems Review of Systems  10 systems reviewed and found to be negative, except as noted in the HPI.   Physical Exam Updated Vital Signs BP 121/81 (BP Location: Left Arm)   Pulse 88   Temp 98.2 F (36.8 C)   Resp 16   Ht 5' 4.5" (1.638 m)   Wt 79.4 kg   LMP 06/28/2016   SpO2 100%   BMI 29.57 kg/m   Physical Exam  Constitutional: She appears well-developed and well-nourished.  HENT:  Head: Normocephalic.  Right Ear: External ear normal.  Left Ear: External ear normal.  Mouth/Throat: Oropharynx is clear and moist. No oropharyngeal exudate.  No drooling or stridor. Posterior pharynx mildly erythematous no significant tonsillar hypertrophy. No exudate. Soft palate rises symmetrically. No TTP or induration under tongue.   No tenderness to palpation of frontal or bilateral maxillary sinuses.  Mild mucosal edema in the nares with scant rhinorrhea.  Bilateral tympanic membranes with normal architecture and good light reflex.    Eyes: Conjunctivae and EOM are normal. Pupils are equal, round, and reactive to light.  Neck: Normal range of motion. Neck supple.  Cardiovascular: Normal rate and regular rhythm.   Pulmonary/Chest: Effort normal and breath sounds normal. No stridor. No respiratory distress. She has no wheezes. She has  no rales. She exhibits no tenderness.  Abdominal: Soft. There is no tenderness. There is no rebound and no guarding.  Nursing note and vitals reviewed.    ED Treatments / Results  Labs (all labs ordered are listed, but only abnormal results are displayed) Labs Reviewed - No data to display  EKG  EKG Interpretation None       Radiology Dg Chest 2 View  Result Date: 07/15/2016 CLINICAL DATA:  Cough producing clear sputum for 2  days, sore throat EXAM: CHEST  2 VIEW COMPARISON:  None FINDINGS: Normal heart size, mediastinal contours, and pulmonary vascularity. Lungs clear. No pleural effusion or pneumothorax. Bones unremarkable. IMPRESSION: Normal exam. Electronically Signed   By: Ulyses Southward M.D.   On: 07/15/2016 09:45    Procedures Procedures (including critical care time)  Medications Ordered in ED Medications  oseltamivir (TAMIFLU) capsule 75 mg (not administered)  lidocaine (XYLOCAINE) 2 % viscous mouth solution 20 mL (not administered)     Initial Impression / Assessment and Plan / ED Course  I have reviewed the triage vital signs and the nursing notes.  Pertinent labs & imaging results that were available during my care of the patient were reviewed by me and considered in my medical decision making (see chart for details).    Vitals:   07/15/16 0852  BP: 121/81  Pulse: 88  Resp: 16  Temp: 98.2 F (36.8 C)  SpO2: 100%  Weight: 79.4 kg  Height: 5' 4.5" (1.638 m)    Medications  oseltamivir (TAMIFLU) capsule 75 mg (not administered)  lidocaine (XYLOCAINE) 2 % viscous mouth solution 20 mL (not administered)    Christy Carroll is 41 y.o. female presenting with symptoms consistent with influenza. Patient is nontoxic appearing. Vital signs reassuring with excellent oxygen saturation, lung sounds are clear to auscultation, doubt superimposed pneumonia. She has no other comorbidities. In shared decision-making we have discussed the pros and cons of initiating Tamiflu and patient would like to start Tamiflu.. Work note is provided, I counseled patient on infection control precautions. We've had an extensive discussion of return precautions and patient verbalizes understanding and teach back technique.   Evaluation does not show pathology that would require ongoing emergent intervention or inpatient treatment. Pt is hemodynamically stable and mentating appropriately. Discussed findings and plan with  patient/guardian, who agrees with care plan. All questions answered. Return precautions discussed and outpatient follow up given.      Final Clinical Impressions(s) / ED Diagnoses   Final diagnoses:  Influenza-like illness    New Prescriptions New Prescriptions   HYDROCODONE-ACETAMINOPHEN (NORCO/VICODIN) 5-325 MG TABLET    Take 1-2 tablets by mouth every 6 hours as needed for pain.   ONDANSETRON (ZOFRAN) 4 MG TABLET    Take 1 tablet (4 mg total) by mouth every 8 (eight) hours as needed for nausea or vomiting.   OSELTAMIVIR (TAMIFLU) 75 MG CAPSULE    Take 1 capsule (75 mg total) by mouth every 12 (twelve) hours.     Wynetta Emery, PA-C 07/15/16 1006    Laurence Spates, MD 07/15/16 [phone]

## 2016-07-15 NOTE — ED Triage Notes (Signed)
Pt reports cough producing clear sputum x Saturday along with sore throat, denies any fevers or other c/o.

## 2017-02-11 ENCOUNTER — Emergency Department (HOSPITAL_BASED_OUTPATIENT_CLINIC_OR_DEPARTMENT_OTHER)
Admission: EM | Admit: 2017-02-11 | Discharge: 2017-02-11 | Disposition: A | Discharge status: Home / Self Care | Payer: Medicaid Other | Attending: Emergency Medicine | Admitting: Emergency Medicine

## 2017-02-11 ENCOUNTER — Encounter (HOSPITAL_BASED_OUTPATIENT_CLINIC_OR_DEPARTMENT_OTHER): Payer: Self-pay | Admitting: *Deleted

## 2017-02-11 DIAGNOSIS — J029 Acute pharyngitis, unspecified: Secondary | ICD-10-CM | POA: Diagnosis not present

## 2017-02-11 DIAGNOSIS — A599 Trichomoniasis, unspecified: Secondary | ICD-10-CM | POA: Insufficient documentation

## 2017-02-11 DIAGNOSIS — R3121 Asymptomatic microscopic hematuria: Secondary | ICD-10-CM | POA: Diagnosis not present

## 2017-02-11 DIAGNOSIS — N898 Other specified noninflammatory disorders of vagina: Secondary | ICD-10-CM | POA: Diagnosis present

## 2017-02-11 LAB — URINALYSIS, ROUTINE W REFLEX MICROSCOPIC
Bilirubin Urine: NEGATIVE
GLUCOSE, UA: NEGATIVE mg/dL
Ketones, ur: NEGATIVE mg/dL
Nitrite: NEGATIVE
Protein, ur: 30 mg/dL — AB
Specific Gravity, Urine: 1.025 (ref 1.005–1.030)
pH: 6 (ref 5.0–8.0)

## 2017-02-11 LAB — WET PREP, GENITAL
SPERM: NONE SEEN
Yeast Wet Prep HPF POC: NONE SEEN

## 2017-02-11 LAB — URINALYSIS, MICROSCOPIC (REFLEX)

## 2017-02-11 LAB — PREGNANCY, URINE: Preg Test, Ur: NEGATIVE

## 2017-02-11 LAB — RAPID STREP SCREEN (MED CTR MEBANE ONLY): Streptococcus, Group A Screen (Direct): NEGATIVE

## 2017-02-11 MED ORDER — AZITHROMYCIN 250 MG PO TABS
1000.0000 mg | ORAL_TABLET | Freq: Once | ORAL | Status: AC
  Administered 2017-02-11: 1000 mg via ORAL
  Filled 2017-02-11: qty 4

## 2017-02-11 MED ORDER — LIDOCAINE HCL 1 % IJ SOLN
INTRAMUSCULAR | Status: AC
  Administered 2017-02-11: 1.2 mL
  Filled 2017-02-11: qty 10

## 2017-02-11 MED ORDER — METRONIDAZOLE 500 MG PO TABS
2000.0000 mg | ORAL_TABLET | Freq: Once | ORAL | Status: AC
  Administered 2017-02-11: 2000 mg via ORAL
  Filled 2017-02-11: qty 4

## 2017-02-11 MED ORDER — LIDOCAINE VISCOUS 2 % MT SOLN: 15.0000 mL | OROMUCOSAL | 0 refills | Status: AC | PRN

## 2017-02-11 MED ORDER — CEFTRIAXONE SODIUM 250 MG IJ SOLR
250.0000 mg | Freq: Once | INTRAMUSCULAR | Status: AC
  Administered 2017-02-11: 250 mg via INTRAMUSCULAR
  Filled 2017-02-11: qty 250

## 2017-02-11 NOTE — Discharge Instructions (Signed)
You tested positive today for Trichomonas and were treated with Flagyl, an antibiotic, in the emergency department. Your labs for gonorrhea, chlamydia, syphilis, and HIV are pending. If any of these tests are positive, someone from the hospital will call you at the number you gave to registration during your emergency department visit today. However, you have ready been treated for gonorrhea and chlamydia during your visit today.   It is important to let all sexual partners know that you tested positive for Trichomonas because this infection is transmitted during sexual intercourse. [facility] recommends not having any kind of sex for 7 days after being treated. It is important that all of your sexual partners are also treated because you can continue to get reinfected if you have sex with someone that is positive for an STD.   Please do not drink any alcohol for the next 24 hours after being treated with Flagyl because it can cause severe vomiting.   Your rapid strep test today was negative for strep throat. I have provided a prescription for viscous lidocaine. You can drink 15 mL of this medication every 3 hours as needed for sore throat. Please do not take more than 8 doses in a 24-hour period.  Your urine was positive for microscopic blood. Please follow-up with primary care within the next month to have a repeat urinalysis.   If you develop new or worsening symptoms, including severe pelvic pain, fever, chills, or if you continue to have vaginal discharge, please return to the emergency department for re-evaluation.

## 2017-02-11 NOTE — ED Provider Notes (Signed)
[facility]   CSN: [id number] Arrival date & time: 02/11/17  1434     History   Chief Complaint Chief Complaint  Patient presents with  . Vaginal Discharge    HPI Christy Carroll is a 41 y.o. female who presents to the emergency department with a chief complaint of sore throat 2 days. She also complains of light green vaginal discharge that began 3-4 days ago with intermittent vaginal itching. She also reports intermittent, non-radiating, bilateral lower abdominal cramping that began this morning. She denies fever, chills, back pain, dysuria, urinary frequency or hesitancy, hematuria, vaginal bleeding, congestion, otalgia, rhinorrhea, shortness breath, or chest pain. She reports her sore throat is worse with eating and drinking and alleviated by nothing. No treatment prior to arrival. She reports that her son has recently also had a sore throat and was diagnosed with a virus.   LMP 01/29/2017 with no recent spotting or vaginal bleeding. She reports that she is currently sexually active with 1 female partner. She reports that she did have oral sex within the last few days before her sore throat began.  No treatment prior to arrival. She denies any lesions to the back of her throat. She reports that she has previously been treated for Trichomonas, but no other STIs. She reports that she had an abortion in June 2018. medications with the procedure, but reports that she was not compliant with follow-up after the procedure.  The history is provided by the patient. No language interpreter was used.  Vaginal Discharge   Pertinent negatives include no fever, no abdominal pain, no diarrhea, no nausea, no vomiting, no dyspareunia, no dysuria and no frequency.    History reviewed. No pertinent past medical history.  There are no active problems to display for this patient.   Past Surgical History:  Procedure Laterality Date  . INDUCED ABORTION      OB History    No data  available       Home Medications    Prior to Admission medications   Medication Sig Start Date End Date Taking? Authorizing Provider  HYDROcodone-acetaminophen (NORCO/VICODIN) 5-325 MG tablet Take 1-2 tablets by mouth every 6 hours as needed for pain. 07/15/16   Pisciotta, Joni ReiningNicole, PA-C  lidocaine (XYLOCAINE) 2 % solution Use as directed 15 mLs in the mouth or throat every 3 (three) hours as needed for mouth pain. 02/11/17   McDonald, Mia A, PA-C  ondansetron (ZOFRAN) 4 MG tablet Take 1 tablet (4 mg total) by mouth every 8 (eight) hours as needed for nausea or vomiting. 07/15/16   Pisciotta, Mardella LaymanNicole, PA-C    Family History History reviewed. No pertinent family history.  Social History Social History  Substance Use Topics  . Smoking status: Never Smoker  . Smokeless tobacco: Never Used  . Alcohol use No     Allergies   Patient has no known allergies.   Review of Systems Review of Systems  Constitutional: Negative for activity change, chills and fever.  HENT: Positive for sore throat and trouble swallowing. Negative for congestion, ear pain, mouth sores, rhinorrhea, sinus pain, sinus pressure, tinnitus and voice change.   Respiratory: Negative for shortness of breath.   Cardiovascular: Negative for chest pain.  Gastrointestinal: Negative for abdominal pain, diarrhea, nausea and vomiting.  Genitourinary: Positive for vaginal discharge. Negative for dyspareunia, dysuria, frequency, genital sores, hematuria, menstrual problem, urgency, vaginal bleeding and vaginal pain.  Musculoskeletal: Negative for back pain.  Skin: Negative for rash.  Neurological: Negative for  headaches.   Physical Exam Updated Vital Signs BP 118/76 (BP Location: Right Arm)   Pulse 81   Temp 98.3 F (36.8 C)   Resp 18   Ht 5\' 4"  (1.626 m)   Wt 81.6 kg (180 lb)   LMP 01/28/2017   SpO2 99%   BMI 30.90 kg/m   Physical Exam  Constitutional: No distress.  HENT:  Head: Normocephalic.  Bilateral TMs are  impacted with cerumen. Bilateral canals are non-edematous and non-erythematous. Mild edema to the bilateral nares with mild rhinorrhea. Mild erythema to the posterior oropharynx. No edema, lesions, or exudate. No tonsillar swelling or exudates. No trismus.   Eyes: Conjunctivae are normal.  Neck: Neck supple.  Cardiovascular: Normal rate, regular rhythm, normal heart sounds and intact distal pulses.  Exam reveals no gallop and no friction rub.   No murmur heard. Pulmonary/Chest: Effort normal. No respiratory distress. She has no wheezes. She has no rales.  Abdominal: Soft. Bowel sounds are normal. She exhibits no distension. There is no tenderness. There is no rebound and no guarding.  No reproducible abdominal tenderness on exam. No CVA tenderness bilaterally.  Genitourinary:  Genitourinary Comments: Chaperoned exam. Copious amounts of thick yellow discharge. Mild left adnexal tenderness. No right adnexal tenderness. No cervical motion tenderness. Uterus appeared retroverted.  Neurological: She is alert.  Skin: Skin is warm. No rash noted. She is not diaphoretic.  Psychiatric: Her behavior is normal.  Nursing note and vitals reviewed.    ED Treatments / Results  Labs (all labs ordered are listed, but only abnormal results are displayed) Labs Reviewed  WET PREP, GENITAL - Abnormal; Notable for the following:       Result Value   Trich, Wet Prep PRESENT (*)    Clue Cells Wet Prep HPF POC PRESENT (*)    WBC, Wet Prep HPF POC MANY (*)    All other components within normal limits  URINALYSIS, ROUTINE W REFLEX MICROSCOPIC - Abnormal; Notable for the following:    APPearance CLOUDY (*)    Hgb urine dipstick MODERATE (*)    Protein, ur 30 (*)    Leukocytes, UA MODERATE (*)    All other components within normal limits  URINALYSIS, MICROSCOPIC (REFLEX) - Abnormal; Notable for the following:    Bacteria, UA MANY (*)    Squamous Epithelial / LPF 6-30 (*)    All other components within normal  limits  RAPID STREP SCREEN (NOT AT [facility])  URINE CULTURE  CULTURE, GROUP A STREP ([facility])  PREGNANCY, URINE  RPR  HIV ANTIBODY (ROUTINE TESTING)  GC/CHLAMYDIA PROBE AMP ([place]) NOT AT [facility]    EKG  EKG Interpretation None       Radiology No results found.  Procedures Procedures (including critical care time)  Medications Ordered in ED Medications  cefTRIAXone (ROCEPHIN) injection 250 mg (not administered)  azithromycin (ZITHROMAX) tablet 1,000 mg (not administered)  metroNIDAZOLE (FLAGYL) tablet 2,000 mg (not administered)  lidocaine (XYLOCAINE) 1 % (with pres) injection (not administered)     Initial Impression / Assessment and Plan / ED Course  I have reviewed the triage vital signs and the nursing notes.  Pertinent labs & imaging results that were available during my care of the patient were reviewed by me and considered in my medical decision making (see chart for details).     41 year old female presenting with vaginal discharge, abdominal pain, and sore throat. Rapid strep negative. Patient's exam consistent with viral pharyngitis. Will provide viscous lidocaine for symptom treatment. The  patient reports she has also had oral sex since the onset of her vaginal discharge. The sore throat could be secondary to trichomonas.  Wet prep positive for Trichomonas. Patient to be discharged with instructions to follow up with OBGYN. Discussed importance of using protection when sexually active. Pt understands that they have GC/Chlamydia cultures pending and that they will need to inform all sexual partners if results return positive. Pt has been treated prophylacticly with azithromycin and rocephin due to pts history, pelvic exam, and wet prep with increased WBCs. Pt not concerning for PID because hemodynamically stable and no cervical motion tenderness on pelvic exam. Pt has also been treated with flagyl for Trichomonas. Pt has been advised to not drink alcohol while on this  medication. The patient also has hemoglobin on her urinalysis. She denies gross hematuria or vaginal bleeding. Encourage the patient to follow up with primary care to have a repeat urinalysis to ensure that this has resolved. Urine culture pending. Strict return precautions given. No acute distress. The patient safe for discharge at this time.   Final Clinical Impressions(s) / ED Diagnoses   Final diagnoses:  Trichimoniasis  Asymptomatic microscopic hematuria  Sore throat    New Prescriptions New Prescriptions   LIDOCAINE (XYLOCAINE) 2 % SOLUTION    Use as directed 15 mLs in the mouth or throat every 3 (three) hours as needed for mouth pain.     Barkley Boards, PA-C 02/11/17 1833    Tegeler, Canary Brim, MD 02/11/17 [phone]

## 2017-02-11 NOTE — ED Triage Notes (Signed)
Pt c/o vaginal discharge x 1 week.  

## 2017-02-12 LAB — HIV ANTIBODY (ROUTINE TESTING W REFLEX): HIV SCREEN 4TH GENERATION: NONREACTIVE

## 2017-02-12 LAB — RPR: RPR Ser Ql: NONREACTIVE

## 2017-02-13 LAB — GC/CHLAMYDIA PROBE AMP (~~LOC~~) NOT AT ARMC
Chlamydia: NEGATIVE
Neisseria Gonorrhea: NEGATIVE

## 2017-02-14 LAB — CULTURE, GROUP A STREP (THRC)

## 2017-02-15 LAB — URINE CULTURE: Culture: 20000 — AB

## 2017-02-16 ENCOUNTER — Telehealth: Payer: Self-pay | Admitting: *Deleted

## 2017-02-16 NOTE — Telephone Encounter (Signed)
Post ED Visit - Positive Culture Follow-up  Culture report reviewed by antimicrobial stewardship pharmacist:  []  Christy Carroll, Pharm.D. []  Christy Carroll, Pharm.D., BCPS AQ-ID []  Christy Carroll, Pharm.D., BCPS []  Christy Carroll, Pharm.D., BCPS []  Christy Carroll, [address]Pharm.D., BCPS, AAHIVP []  Christy Carroll, Pharm.D., BCPS, AAHIVP [x]  Christy Carroll, PharmD, BCPS []  Christy Carroll, PharmD, BCPS []  Christy Carroll, PharmD, BCPS  Positive urine culture No urinary symptoms and no further patient follow-up is required at this time.  Christy Carroll, Christy Carroll [facility] 02/16/2017, 11:20 AM

## 2017-02-27 ENCOUNTER — Emergency Department (HOSPITAL_BASED_OUTPATIENT_CLINIC_OR_DEPARTMENT_OTHER)
Admission: EM | Admit: 2017-02-27 | Discharge: 2017-02-27 | Disposition: A | Discharge status: Home / Self Care | Payer: Medicaid Other | Attending: Emergency Medicine | Admitting: Emergency Medicine

## 2017-02-27 ENCOUNTER — Encounter (HOSPITAL_BASED_OUTPATIENT_CLINIC_OR_DEPARTMENT_OTHER): Payer: Self-pay | Admitting: Emergency Medicine

## 2017-02-27 DIAGNOSIS — Z79899 Other long term (current) drug therapy: Secondary | ICD-10-CM | POA: Insufficient documentation

## 2017-02-27 DIAGNOSIS — N760 Acute vaginitis: Secondary | ICD-10-CM | POA: Insufficient documentation

## 2017-02-27 DIAGNOSIS — K0889 Other specified disorders of teeth and supporting structures: Secondary | ICD-10-CM | POA: Diagnosis not present

## 2017-02-27 DIAGNOSIS — R220 Localized swelling, mass and lump, head: Secondary | ICD-10-CM

## 2017-02-27 DIAGNOSIS — B9689 Other specified bacterial agents as the cause of diseases classified elsewhere: Secondary | ICD-10-CM

## 2017-02-27 DIAGNOSIS — R102 Pelvic and perineal pain: Secondary | ICD-10-CM | POA: Diagnosis present

## 2017-02-27 LAB — URINALYSIS, ROUTINE W REFLEX MICROSCOPIC
BILIRUBIN URINE: NEGATIVE
Glucose, UA: NEGATIVE mg/dL
KETONES UR: NEGATIVE mg/dL
NITRITE: NEGATIVE
PH: 6.5 (ref 5.0–8.0)
Protein, ur: NEGATIVE mg/dL
SPECIFIC GRAVITY, URINE: 1.015 (ref 1.005–1.030)

## 2017-02-27 LAB — WET PREP, GENITAL
SPERM: NONE SEEN
TRICH WET PREP: NONE SEEN
YEAST WET PREP: NONE SEEN

## 2017-02-27 LAB — URINALYSIS, MICROSCOPIC (REFLEX)

## 2017-02-27 LAB — PREGNANCY, URINE: PREG TEST UR: NEGATIVE

## 2017-02-27 MED ORDER — METRONIDAZOLE 500 MG PO TABS: 500.0000 mg | ORAL_TABLET | Freq: Two times a day (BID) | ORAL | 0 refills | Status: DC

## 2017-02-27 NOTE — ED Triage Notes (Signed)
Patient reports that she has had pain to her left mouth x 1 week. She has been seen and treated for this.  patient states that yesterday she started to have a "fullness" to her right tongue. PAtient denies any trouble swallowing or breathing. Patient states that she is also having some vaginal itching

## 2017-02-27 NOTE — ED Provider Notes (Signed)
[facility] Provider Note   CSN: [id number] Arrival date & time: 02/27/17  1650     History   Chief Complaint Chief Complaint  Patient presents with  . Dental Pain    HPI Christy Carroll is a 41 y.o. female.  HPI   41 year old female presenting with multiple complaints. Pt was diagnosed with Trich/BV on 09/04 and was treated.  Had toe infection which was treated with Augmentin around 09/15.  Report tongue and throat fullness with difficulty swallowing x1 week.  Pt sts she reached out to [facility] in regard to her sxs, and was prescribed clotrimazole SL tablets as treatment, without a formal evaluation. Pt has continued to take her Augmentin until this AM.  She has also take clotrimazole without relief.  She still endorse facial swelling and tongue discomfort.  Does report toothache to her left lower jaw.  Pt does report having a dental caries to left lower jaw which has been ongoing x 1 week and painful.  No associated fever, chills, cp, sob, n/v, abd cramping, lightheadedness or dizziness.  Did report having sexual intercourse recently.  sts she is still having vaginal discomfort without discharge or dysuria.      History reviewed. No pertinent past medical history.  There are no active problems to display for this patient.   Past Surgical History:  Procedure Laterality Date  . INDUCED ABORTION      OB History    No data available       Home Medications    Prior to Admission medications   Medication Sig Start Date End Date Taking? Authorizing Provider  amoxicillin (AMOXIL) 500 MG tablet Take 500 mg by mouth 2 (two) times daily.   Yes [provider]  clotrimazole (MYCELEX) 10 MG troche Take 10 mg by mouth 5 (five) times daily.   Yes [provider]  HYDROcodone-acetaminophen (NORCO/VICODIN) 5-325 MG tablet Take 1-2 tablets by mouth every 6 hours as needed for pain. 07/15/16   Pisciotta, Joni Reining, PA-C  lidocaine (XYLOCAINE) 2 % solution Use as  directed 15 mLs in the mouth or throat every 3 (three) hours as needed for mouth pain. 02/11/17   McDonald, Mia A, PA-C  ondansetron (ZOFRAN) 4 MG tablet Take 1 tablet (4 mg total) by mouth every 8 (eight) hours as needed for nausea or vomiting. 07/15/16   Pisciotta, Mardella Layman    Family History History reviewed. No pertinent family history.  Social History Social History  Substance Use Topics  . Smoking status: Never Smoker  . Smokeless tobacco: Never Used  . Alcohol use No     Allergies   Patient has no known allergies.   Review of Systems Review of Systems  All other systems reviewed and are negative.    Physical Exam Updated Vital Signs BP 127/87 (BP Location: Right Arm)   Pulse 80   Temp 98.5 F (36.9 C) (Oral)   Resp 18   Ht  (1.626 m)   Wt 81.6 kg (180 lb)   LMP 01/28/2017   SpO2 98%   BMI 30.90 kg/m   Physical Exam  Constitutional: She appears well-developed and well-nourished. No distress.  HENT:  Head: Atraumatic.  Ears: TM obscured by cerumen bilaterally Nose: normal nares Throat: uvula midline, no tonsillar enlargement or exudates, no trismus, normal tongue, normal oral mucosa.  L face swelling noted, no significant dental caries or reproducible dental pain.  No dental abscess.  Eyes: Conjunctivae are normal.  Neck: Normal range of  motion. Neck supple. No tracheal deviation present. No thyromegaly present.  Cardiovascular: Normal rate and regular rhythm.   Pulmonary/Chest: Effort normal and breath sounds normal.  Abdominal: Soft. Bowel sounds are normal. She exhibits no distension. There is no tenderness.  Genitourinary:  Genitourinary Comments: Pelvic exam: RN in room as chaperone, external female genitalia normal with no signs of lesions or injuries. Speculum exam shows normal cervix with significant yellow discharge. Bimanual exam with no adnexal tenderness, no cervical motion tenderness, uterus normal size and nontender, no masses appreciated.  The external cervical os is closed.   Lymphadenopathy:    She has no cervical adenopathy.  Neurological: She is alert.  Skin: No rash noted.  Psychiatric: She has a normal mood and affect.  Nursing note and vitals reviewed.    ED Treatments / Results  Labs (all labs ordered are listed, but only abnormal results are displayed) Labs Reviewed  WET PREP, GENITAL - Abnormal; Notable for the following:       Result Value   Clue Cells Wet Prep HPF POC PRESENT (*)    WBC, Wet Prep HPF POC FEW (*)    All other components within normal limits  URINALYSIS, ROUTINE W REFLEX MICROSCOPIC - Abnormal; Notable for the following:    APPearance CLOUDY (*)    Hgb urine dipstick TRACE (*)    Leukocytes, UA SMALL (*)    All other components within normal limits  URINALYSIS, MICROSCOPIC (REFLEX) - Abnormal; Notable for the following:    Bacteria, UA RARE (*)    Squamous Epithelial / LPF 6-30 (*)    All other components within normal limits  PREGNANCY, URINE  GC/CHLAMYDIA PROBE AMP ([place]) NOT AT [facility]    EKG  EKG Interpretation None       Radiology No results found.  Procedures Pelvic exam Date/Time: 02/27/2017 10:28 PM Performed by: Fayrene Helper Authorized by: Fayrene Helper  Consent: Verbal consent obtained. Risks and benefits: risks, benefits and alternatives were discussed Consent given by: patient Patient understanding: patient states understanding of the procedure being performed Patient identity confirmed: verbally with patient Local anesthesia used: no  Anesthesia: Local anesthesia used: no  Sedation: Patient sedated: no Patient tolerance: Patient tolerated the procedure well with no immediate complications    (including critical care time)  Medications Ordered in ED Medications - No data to display   Initial Impression / Assessment and Plan / ED Course  I have reviewed the triage vital signs and the nursing notes.  Pertinent labs & imaging results that were  available during my care of the patient were reviewed by me and considered in my medical decision making (see chart for details).     BP 127/87 (BP Location: Right Arm)   Pulse 80   Temp 98.5 F (36.9 C) (Oral)   Resp 18   Ht  (1.626 m)   Wt 81.6 kg (180 lb)   LMP 01/28/2017   SpO2 98%   BMI 30.90 kg/m    Final Clinical Impressions(s) / ED Diagnoses   Final diagnoses:  Swelling of left side of face  BV (bacterial vaginosis)    New Prescriptions New Prescriptions   METRONIDAZOLE (FLAGYL) 500 MG TABLET    Take 1 tablet (500 mg total) by mouth 2 (two) times daily. One po bid x 7 days   9:06 PM Pt here with vaginal discomfort.  Was treated for trichomonas and BV 2 weeks ago.  Will perform pelvic exam.  Her GC/Ch along with HIV  and RPR was negative during last visit.  She has been taking augmentin for toe infection.  She is having sxs which may suggest drug allergy (tongue discomfort, facial swelling) without airway compromise.  No obvious dental pain on exam.  I would recommend d/c augmentin.  She was also prescribed clotrimazole SL tablet without being evaluated.  No evidence to suggest oral candidiasis on my exam.  Recommend d/c clotrimazole.    11:13 PM Wet prep finding consistent with BV, pt was not given treatment for it during last visit, will treat with Flagyl.    Trichomonas has resolved.  UA showing resolution of prior UTI.  Pt also have appointment with dentist in 2 days.     Fayrene Helper, PA-C 02/27/17 2330    Vanetta Mulders, MD 03/01/17 [phone]

## 2017-02-27 NOTE — Discharge Instructions (Signed)
Please follow up with your dentist for further management of your facial swelling and dental pain.  Stop taking augmentin and clotrimazole.  Take Flagyl as prescribed as treatment for bacterial vaginosis.  Avoid drinking alcohol while taking antibiotic.

## 2017-02-27 NOTE — ED Notes (Signed)
Updated to wait time 

## 2017-02-27 NOTE — ED Notes (Signed)
C/o mouth pain x 1 week fells like mouth is full, states diff eating  No diff breathing

## 2017-03-03 LAB — GC/CHLAMYDIA PROBE AMP (~~LOC~~) NOT AT ARMC
CHLAMYDIA, DNA PROBE: NEGATIVE
Neisseria Gonorrhea: NEGATIVE

## 2017-05-03 ENCOUNTER — Encounter (HOSPITAL_BASED_OUTPATIENT_CLINIC_OR_DEPARTMENT_OTHER): Payer: Self-pay | Admitting: Emergency Medicine

## 2017-05-03 ENCOUNTER — Other Ambulatory Visit: Payer: Self-pay

## 2017-05-03 ENCOUNTER — Emergency Department (HOSPITAL_BASED_OUTPATIENT_CLINIC_OR_DEPARTMENT_OTHER)
Admission: EM | Admit: 2017-05-03 | Discharge: 2017-05-03 | Disposition: A | Discharge status: Home / Self Care | Payer: Medicaid Other | Attending: Emergency Medicine | Admitting: Emergency Medicine

## 2017-05-03 DIAGNOSIS — N898 Other specified noninflammatory disorders of vagina: Secondary | ICD-10-CM | POA: Diagnosis present

## 2017-05-03 DIAGNOSIS — Z79899 Other long term (current) drug therapy: Secondary | ICD-10-CM | POA: Diagnosis not present

## 2017-05-03 DIAGNOSIS — A599 Trichomoniasis, unspecified: Secondary | ICD-10-CM

## 2017-05-03 DIAGNOSIS — N730 Acute parametritis and pelvic cellulitis: Secondary | ICD-10-CM | POA: Diagnosis not present

## 2017-05-03 LAB — WET PREP, GENITAL
CLUE CELLS WET PREP: NONE SEEN
Sperm: NONE SEEN
Yeast Wet Prep HPF POC: NONE SEEN

## 2017-05-03 LAB — URINALYSIS, ROUTINE W REFLEX MICROSCOPIC
Bilirubin Urine: NEGATIVE
GLUCOSE, UA: NEGATIVE mg/dL
Ketones, ur: NEGATIVE mg/dL
Nitrite: NEGATIVE
Protein, ur: NEGATIVE mg/dL
SPECIFIC GRAVITY, URINE: 1.025 (ref 1.005–1.030)
pH: 6 (ref 5.0–8.0)

## 2017-05-03 LAB — URINALYSIS, MICROSCOPIC (REFLEX)

## 2017-05-03 LAB — PREGNANCY, URINE: Preg Test, Ur: NEGATIVE

## 2017-05-03 MED ORDER — DOXYCYCLINE HYCLATE 100 MG PO CAPS: 100.0000 mg | ORAL_CAPSULE | Freq: Two times a day (BID) | ORAL | 0 refills | Status: AC

## 2017-05-03 MED ORDER — AZITHROMYCIN 1 G PO PACK
1.0000 g | PACK | Freq: Once | ORAL | Status: AC
  Administered 2017-05-03: 1 g via ORAL
  Filled 2017-05-03: qty 1

## 2017-05-03 MED ORDER — METRONIDAZOLE 500 MG PO TABS
2000.0000 mg | ORAL_TABLET | Freq: Once | ORAL | Status: AC
  Administered 2017-05-03: 2000 mg via ORAL
  Filled 2017-05-03: qty 4

## 2017-05-03 MED ORDER — CEFTRIAXONE SODIUM 250 MG IJ SOLR
250.0000 mg | Freq: Once | INTRAMUSCULAR | Status: AC
  Administered 2017-05-03: 250 mg via INTRAMUSCULAR
  Filled 2017-05-03: qty 250

## 2017-05-03 MED ORDER — ONDANSETRON 8 MG PO TBDP
8.0000 mg | ORAL_TABLET | Freq: Once | ORAL | Status: AC
  Administered 2017-05-03: 8 mg via ORAL
  Filled 2017-05-03: qty 1

## 2017-05-03 NOTE — ED Provider Notes (Signed)
[facility] EMERGENCY DEPARTMENT Provider Note   CSN: [id number] Arrival date & time: 05/03/17  0448     History   Chief Complaint No chief complaint on file.   HPI Christy Carroll is a 41 y.o. female.  The history is provided by the patient.  Vaginal Discharge   This is a recurrent problem. The current episode started yesterday. The problem occurs constantly. The problem has not changed since onset.The discharge occurs spontaneously. The discharge was yellow. She is not pregnant. She has not missed her period. Associated symptoms include nausea and vomiting. Pertinent negatives include no anorexia and no fever. She has tried nothing for the symptoms. The treatment provided no relief. Her past medical history is significant for STD.    History reviewed. No pertinent past medical history.  There are no active problems to display for this patient.   Past Surgical History:  Procedure Laterality Date  . INDUCED ABORTION      OB History    No data available       Home Medications    Prior to Admission medications   Medication Sig Start Date End Date Taking? Authorizing Provider  amoxicillin (AMOXIL) 500 MG tablet Take 500 mg by mouth 2 (two) times daily.    [provider]  clotrimazole (MYCELEX) 10 MG troche Take 10 mg by mouth 5 (five) times daily.    [provider]  HYDROcodone-acetaminophen (NORCO/VICODIN) 5-325 MG tablet Take 1-2 tablets by mouth every 6 hours as needed for pain. 07/15/16   Pisciotta, Joni ReiningNicole, PA-C  lidocaine (XYLOCAINE) 2 % solution Use as directed 15 mLs in the mouth or throat every 3 (three) hours as needed for mouth pain. 02/11/17   McDonald, Mia A, PA-C  metroNIDAZOLE (FLAGYL) 500 MG tablet Take 1 tablet (500 mg total) by mouth 2 (two) times daily. One po bid x 7 days 02/27/17   Fayrene Helperran, [place], PA-C  ondansetron (ZOFRAN) 4 MG tablet Take 1 tablet (4 mg total) by mouth every 8 (eight) hours as needed for nausea or vomiting. 07/15/16    Pisciotta, Joni ReiningNicole, PA-C    Family History No family history on file.  Social History Social History   Tobacco Use  . Smoking status: Never Smoker  . Smokeless tobacco: Never Used  Substance Use Topics  . Alcohol use: No  . Drug use: No     Allergies   Patient has no known allergies.   Review of Systems Review of Systems  Constitutional: Negative for fever.  Gastrointestinal: Positive for nausea and vomiting. Negative for anorexia.  Genitourinary: Positive for vaginal discharge.  All other systems reviewed and are negative.    Physical Exam Updated Vital Signs BP 125/86 (BP Location: Right Arm)   Pulse 90   Temp 98.3 F (36.8 C) (Oral)   Resp 18   Ht 5' 4.5" (1.638 m)   Wt 81.6 kg (180 lb)   LMP 04/10/2017   SpO2 100%   BMI 30.42 kg/m   Physical Exam  Constitutional: She is oriented to person, place, and time. She appears well-developed and well-nourished. No distress.  HENT:  Head: Normocephalic and atraumatic.  Mouth/Throat: No oropharyngeal exudate.  Eyes: Conjunctivae are normal. Pupils are equal, round, and reactive to light.  Neck: Normal range of motion. Neck supple. No JVD present.  Cardiovascular: Normal rate, regular rhythm, normal heart sounds and intact distal pulses.  Pulmonary/Chest: Effort normal and breath sounds normal. No stridor. She has no wheezes. She has no rales.  Abdominal:  Soft. Bowel sounds are normal. She exhibits no mass. There is no tenderness. There is no rebound and no guarding.  Genitourinary: Vaginal discharge found.  Genitourinary Comments: Chaperone present copious mucopurulent discharge with bubbles.    Musculoskeletal: Normal range of motion.  Neurological: She is alert and oriented to person, place, and time.  Skin: Skin is warm and dry. Capillary refill takes less than 2 seconds.  Psychiatric: She has a normal mood and affect.     ED Treatments / Results  Labs (all labs ordered are listed, but only abnormal  results are displayed)  Results for orders placed or performed during the hospital encounter of 05/03/17  Wet prep, genital  Result Value Ref Range   Yeast Wet Prep HPF POC NONE SEEN NONE SEEN   Trich, Wet Prep PRESENT (A) NONE SEEN   Clue Cells Wet Prep HPF POC NONE SEEN NONE SEEN   WBC, Wet Prep HPF POC MANY (A) NONE SEEN   Sperm NONE SEEN   Pregnancy, urine  Result Value Ref Range   Preg Test, Ur NEGATIVE NEGATIVE  Urinalysis, Routine w reflex microscopic  Result Value Ref Range   Color, Urine YELLOW YELLOW   APPearance CLOUDY (A) CLEAR   Specific Gravity, Urine 1.025 1.005 - 1.030   pH 6.0 5.0 - 8.0   Glucose, UA NEGATIVE NEGATIVE mg/dL   Hgb urine dipstick MODERATE (A) NEGATIVE   Bilirubin Urine NEGATIVE NEGATIVE   Ketones, ur NEGATIVE NEGATIVE mg/dL   Protein, ur NEGATIVE NEGATIVE mg/dL   Nitrite NEGATIVE NEGATIVE   Leukocytes, UA LARGE (A) NEGATIVE  Urinalysis, Microscopic (reflex)  Result Value Ref Range   RBC / HPF 6-30 0 - 5 RBC/hpf   WBC, UA 6-30 0 - 5 WBC/hpf   Bacteria, UA MANY (A) NONE SEEN   Squamous Epithelial / LPF 6-30 (A) NONE SEEN   Trichomonas, UA PRESENT    No results found.  Radiology No results found.  Procedures Procedures (including critical care time)  Medications Ordered in ED  Medications  ondansetron (ZOFRAN-ODT) disintegrating tablet 8 mg (8 mg Oral Given 05/03/17 0503)  cefTRIAXone (ROCEPHIN) injection 250 mg (250 mg Intramuscular Given 05/03/17 0516)  azithromycin (ZITHROMAX) powder 1 g (1 g Oral Given 05/03/17 0515)  metroNIDAZOLE (FLAGYL) tablet 2,000 mg (2,000 mg Oral Given 05/03/17 0533)    Final Clinical Impressions(s) / ED Diagnoses  No sexual activity until 7 days after all partners treated.  Follow up with your GYN for repeat exam and testing.   All questions answered to the patient's satisfaction.  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  Patient and family state  he already knew about the liver metastasis.  Will start lidoderm patches.    Strict return precautions for fever, inability to open the mouth, inability to speech, swelling behind the ear or in the neck, fevers,  global weakness, vomiting, swelling or the lips or tongue, chest pain, dyspnea on exertion,shortness of breath, persistent pain, Inability to tolerate liquids or food, cough, altered mental status or any concerns. No signs of systemic illness or infection. The patient is nontoxic-appearing on exam and vital signs are within normal limits.    I have reviewed the triage vital signs and the nursing notes. Pertinent labs &imaging results that were available during my care of the patient were reviewed by me and considered in my medical decision making (see chart for details).  After history, exam, and medical workup I feel the  patient has been appropriately medically screened and is safe for discharge home. Pertinent diagnoses were discussed with the patient. Patient was given return precautions   Teyla Skidgel, MD 05/03/17 2130

## 2017-05-03 NOTE — ED Triage Notes (Signed)
Pt presents with multiple complaints: lower back pain, lower abdominal cramps, vaginal discharge, nausea/vomiting/diarrhea yesterday.

## 2017-05-03 NOTE — ED Notes (Signed)
Pt discharged to home NAD.  

## 2017-05-05 LAB — GC/CHLAMYDIA PROBE AMP (~~LOC~~) NOT AT ARMC
CHLAMYDIA, DNA PROBE: NEGATIVE
NEISSERIA GONORRHEA: NEGATIVE

## 2017-06-01 ENCOUNTER — Emergency Department (HOSPITAL_BASED_OUTPATIENT_CLINIC_OR_DEPARTMENT_OTHER): Payer: Medicaid Other

## 2017-06-01 ENCOUNTER — Emergency Department (HOSPITAL_BASED_OUTPATIENT_CLINIC_OR_DEPARTMENT_OTHER)
Admission: EM | Admit: 2017-06-01 | Discharge: 2017-06-01 | Disposition: A | Discharge status: Home / Self Care | Payer: Medicaid Other | Attending: Emergency Medicine | Admitting: Emergency Medicine

## 2017-06-01 ENCOUNTER — Encounter (HOSPITAL_BASED_OUTPATIENT_CLINIC_OR_DEPARTMENT_OTHER): Payer: Self-pay | Admitting: Emergency Medicine

## 2017-06-01 ENCOUNTER — Other Ambulatory Visit: Payer: Self-pay

## 2017-06-01 DIAGNOSIS — R079 Chest pain, unspecified: Secondary | ICD-10-CM | POA: Insufficient documentation

## 2017-06-01 DIAGNOSIS — B9689 Other specified bacterial agents as the cause of diseases classified elsewhere: Secondary | ICD-10-CM

## 2017-06-01 DIAGNOSIS — N760 Acute vaginitis: Secondary | ICD-10-CM | POA: Insufficient documentation

## 2017-06-01 DIAGNOSIS — A5901 Trichomonal vulvovaginitis: Secondary | ICD-10-CM | POA: Insufficient documentation

## 2017-06-01 DIAGNOSIS — M549 Dorsalgia, unspecified: Secondary | ICD-10-CM | POA: Insufficient documentation

## 2017-06-01 DIAGNOSIS — N898 Other specified noninflammatory disorders of vagina: Secondary | ICD-10-CM | POA: Diagnosis present

## 2017-06-01 DIAGNOSIS — A599 Trichomoniasis, unspecified: Secondary | ICD-10-CM

## 2017-06-01 HISTORY — DX: Trichomoniasis, unspecified: A59.9

## 2017-06-01 LAB — CBC WITH DIFFERENTIAL/PLATELET
Basophils Absolute: 0 10*3/uL (ref 0.0–0.1)
Basophils Relative: 0 %
EOS ABS: 0.1 10*3/uL (ref 0.0–0.7)
EOS PCT: 1 %
HCT: 34.9 % — ABNORMAL LOW (ref 36.0–46.0)
Hemoglobin: 11.6 g/dL — ABNORMAL LOW (ref 12.0–15.0)
LYMPHS ABS: 2.2 10*3/uL (ref 0.7–4.0)
Lymphocytes Relative: 24 %
MCH: 26 pg (ref 26.0–34.0)
MCHC: 33.2 g/dL (ref 30.0–36.0)
MCV: 78.3 fL (ref 78.0–100.0)
MONOS PCT: 9 %
Monocytes Absolute: 0.8 10*3/uL (ref 0.1–1.0)
Neutro Abs: 6.2 10*3/uL (ref 1.7–7.7)
Neutrophils Relative %: 66 %
PLATELETS: 263 10*3/uL (ref 150–400)
RBC: 4.46 MIL/uL (ref 3.87–5.11)
RDW: 13.6 % (ref 11.5–15.5)
WBC: 9.3 10*3/uL (ref 4.0–10.5)

## 2017-06-01 LAB — COMPREHENSIVE METABOLIC PANEL
ALT: 12 U/L — AB (ref 14–54)
AST: 23 U/L (ref 15–41)
Albumin: 4 g/dL (ref 3.5–5.0)
Alkaline Phosphatase: 79 U/L (ref 38–126)
Anion gap: 9 (ref 5–15)
BUN: 9 mg/dL (ref 6–20)
CHLORIDE: 104 mmol/L (ref 101–111)
CO2: 25 mmol/L (ref 22–32)
CREATININE: 0.67 mg/dL (ref 0.44–1.00)
Calcium: 8.9 mg/dL (ref 8.9–10.3)
GFR calc Af Amer: >60 mL/min (ref >60)
Glucose, Bld: 81 mg/dL (ref 65–99)
Potassium: 4 mmol/L (ref 3.5–5.1)
Sodium: 138 mmol/L (ref 135–145)
Total Bilirubin: 0.6 mg/dL (ref 0.3–1.2)
Total Protein: 7.7 g/dL (ref 6.5–8.1)

## 2017-06-01 LAB — WET PREP, GENITAL
Sperm: NONE SEEN
YEAST WET PREP: NONE SEEN

## 2017-06-01 LAB — URINALYSIS, ROUTINE W REFLEX MICROSCOPIC
Bilirubin Urine: NEGATIVE
GLUCOSE, UA: NEGATIVE mg/dL
Ketones, ur: NEGATIVE mg/dL
Nitrite: NEGATIVE
PH: 6 (ref 5.0–8.0)
PROTEIN: NEGATIVE mg/dL
SPECIFIC GRAVITY, URINE: 1.02 (ref 1.005–1.030)

## 2017-06-01 LAB — URINALYSIS, MICROSCOPIC (REFLEX)

## 2017-06-01 LAB — PREGNANCY, URINE: PREG TEST UR: NEGATIVE

## 2017-06-01 MED ORDER — METRONIDAZOLE 500 MG PO TABS
ORAL_TABLET | ORAL | Status: AC
  Filled 2017-06-01: qty 1

## 2017-06-01 MED ORDER — METRONIDAZOLE 500 MG PO TABS: 500.0000 mg | ORAL_TABLET | Freq: Two times a day (BID) | ORAL | 0 refills | Status: AC

## 2017-06-01 MED ORDER — METRONIDAZOLE 500 MG PO TABS
2000.0000 mg | ORAL_TABLET | Freq: Once | ORAL | Status: AC
  Administered 2017-06-01: 2000 mg via ORAL
  Filled 2017-06-01: qty 4

## 2017-06-01 NOTE — ED Triage Notes (Signed)
Patient states that she has had generalized aches to her chest and back  - Patient also reports that she is having some chest pain to her mid chest. She also reports that she is having vaginal d/c and vaginal iching, with abdominal pain to her right flank region.

## 2017-06-01 NOTE — ED Notes (Signed)
Pt given d/c instructions as per chart. Rx x 1 with precautions. Verbalizes understanding. No questions. 

## 2017-06-01 NOTE — ED Provider Notes (Signed)
[facility] EMERGENCY DEPARTMENT Provider Note   CSN: [id number] Arrival date & time: 06/01/17  1154     History   Chief Complaint Chief Complaint  Patient presents with  . Vaginal Itching    HPI Christy Carroll is a 41 y.o. female.  Pt presents to the ED today with generalized aches to her chest and to her back.  She also continues to have vaginal d/c.  The pt said that she was here on 11/24 for the vaginal d/c.  Pt was treated for gc and chl, but swabs were negative.  She was also found to have trich.  She was also treated with flagyl.  The pt said her sexual partner was treated.        Past Medical History:  Diagnosis Date  . Trichomonosis     There are no active problems to display for this patient.   Past Surgical History:  Procedure Laterality Date  . INDUCED ABORTION      OB History    No data available       Home Medications    Prior to Admission medications   Medication Sig Start Date End Date Taking? Authorizing Provider  amoxicillin (AMOXIL) 500 MG tablet Take 500 mg by mouth 2 (two) times daily.    [provider]  clotrimazole (MYCELEX) 10 MG troche Take 10 mg by mouth 5 (five) times daily.    [provider]  doxycycline (VIBRAMYCIN) 100 MG capsule Take 1 capsule (100 mg total) by mouth 2 (two) times daily. One po bid x 14 days 05/03/17   Palumbo, April, MD  HYDROcodone-acetaminophen (NORCO/VICODIN) 5-325 MG tablet Take 1-2 tablets by mouth every 6 hours as needed for pain. 07/15/16   Pisciotta, Joni ReiningNicole, PA-C  lidocaine (XYLOCAINE) 2 % solution Use as directed 15 mLs in the mouth or throat every 3 (three) hours as needed for mouth pain. 02/11/17   McDonald, Mia A, PA-C  metroNIDAZOLE (FLAGYL) 500 MG tablet Take 1 tablet (500 mg total) by mouth 2 (two) times daily. One po bid x 7 days 06/01/17   Jacalyn LefevreHaviland, Breionna Punt, MD  ondansetron (ZOFRAN) 4 MG tablet Take 1 tablet (4 mg total) by mouth every 8 (eight) hours as needed for nausea or  vomiting. 07/15/16   Pisciotta, Mardella LaymanNicole, PA-C    Family History History reviewed. No pertinent family history.  Social History Social History   Tobacco Use  . Smoking status: Never Smoker  . Smokeless tobacco: Never Used  Substance Use Topics  . Alcohol use: No  . Drug use: No     Allergies   Patient has no known allergies.   Review of Systems Review of Systems  Cardiovascular: Positive for chest pain.  Genitourinary: Positive for vaginal discharge.  All other systems reviewed and are negative.    Physical Exam Updated Vital Signs BP 125/77 (BP Location: Left Arm)   Pulse 98   Temp 99.6 F (37.6 C) (Oral)   Resp 20   Ht 5' 4.5" (1.638 m)   Wt 81.6 kg (180 lb)   LMP 05/03/2017   SpO2 97%   BMI 30.42 kg/m   Physical Exam  Constitutional: She appears well-developed and well-nourished.  HENT:  Head: Normocephalic and atraumatic.  Right Ear: External ear normal.  Left Ear: External ear normal.  Nose: Nose normal.  Mouth/Throat: Oropharynx is clear and moist.  Eyes: Conjunctivae and EOM are normal. Pupils are equal, round, and reactive to light.  Neck: Normal range of motion. Neck  supple.  Cardiovascular: Normal rate, regular rhythm, normal heart sounds and intact distal pulses.  Pulmonary/Chest: Effort normal and breath sounds normal.  Abdominal: Soft. Bowel sounds are normal.  Genitourinary: Cervix exhibits discharge. Right adnexum displays tenderness. Left adnexum displays no tenderness. Vaginal discharge found.  Genitourinary Comments: Copious greenish discharge  Skin: Skin is warm. Capillary refill takes less than 2 seconds.  Psychiatric: She has a normal mood and affect. Her behavior is normal. Thought content normal.  Nursing note and vitals reviewed.    ED Treatments / Results  Labs (all labs ordered are listed, but only abnormal results are displayed) Labs Reviewed  WET PREP, GENITAL - Abnormal; Notable for the following components:      Result  Value   Trich, Wet Prep PRESENT (*)    Clue Cells Wet Prep HPF POC PRESENT (*)    WBC, Wet Prep HPF POC MANY (*)    All other components within normal limits  URINALYSIS, ROUTINE W REFLEX MICROSCOPIC - Abnormal; Notable for the following components:   APPearance CLOUDY (*)    Hgb urine dipstick MODERATE (*)    Leukocytes, UA LARGE (*)    All other components within normal limits  COMPREHENSIVE METABOLIC PANEL - Abnormal; Notable for the following components:   ALT 12 (*)    All other components within normal limits  CBC WITH DIFFERENTIAL/PLATELET - Abnormal; Notable for the following components:   Hemoglobin 11.6 (*)    HCT 34.9 (*)    All other components within normal limits  URINALYSIS, MICROSCOPIC (REFLEX) - Abnormal; Notable for the following components:   Bacteria, UA MANY (*)    Squamous Epithelial / LPF 6-30 (*)    All other components within normal limits  PREGNANCY, URINE  GC/CHLAMYDIA PROBE AMP ([place]) [facility]    EKG  EKG Interpretation  Date/Time:  Sunday June 01 2017 12:09:40 EST Ventricular Rate:  100 PR Interval:  134 QRS Duration: 82 QT Interval:  332 QTC Calculation: 428 R Axis:   86 Text Interpretation:  Normal sinus rhythm Nonspecific T wave abnormality Abnormal ECG No significant change since last tracing Confirmed by Jacalyn LefevreHaviland, Michaelle Bottomley [phone]([id number]) on 06/01/2017 12:15:59 PM       Radiology Dg Chest 2 View  Result Date: 06/01/2017 CLINICAL DATA:  41 year old female with mid chest and upper back pain for the past 4 days EXAM: CHEST  2 VIEW COMPARISON:  Prior chest x-ray 07/15/2016 FINDINGS: The lungs are clear and negative for focal airspace consolidation, pulmonary edema or suspicious pulmonary nodule. No pleural effusion or pneumothorax. Cardiac and mediastinal contours are within normal limits. No acute fracture or lytic or blastic osseous lesions. The visualized upper abdominal bowel gas pattern is unremarkable. IMPRESSION: Negative chest  x-ray. Electronically Signed   By: Malachy MoanHeath  McCullough M.D.   On: 06/01/2017 13:38    Procedures Procedures (including critical care time)  Medications Ordered in ED Medications  metroNIDAZOLE (FLAGYL) tablet 2,000 mg (not administered)     Initial Impression / Assessment and Plan / ED Course  I have reviewed the triage vital signs and the nursing notes.  Pertinent labs & imaging results that were available during my care of the patient were reviewed by me and considered in my medical decision making (see chart for details).    Pt had neg gonorrhea and chlamydia on 11/24, so I did not treat her for that while here.  The pt is given her first dose of flagyl for trich and bv.  She  is encouraged to have her sexual partners treated or she will continue to have sx.  Return if worse.  F/u with [facility] clinic.  Final Clinical Impressions(s) / ED Diagnoses   Final diagnoses:  Trichimoniasis  Bacterial vaginosis    ED Discharge Orders        Ordered    metroNIDAZOLE (FLAGYL) 500 MG tablet  2 times daily     06/01/17 1507       Jacalyn Lefevre, MD 06/01/17 [phone]

## 2017-06-02 LAB — GC/CHLAMYDIA PROBE AMP (~~LOC~~) NOT AT ARMC
Chlamydia: NEGATIVE
Neisseria Gonorrhea: NEGATIVE

## 2017-08-19 ENCOUNTER — Encounter (HOSPITAL_BASED_OUTPATIENT_CLINIC_OR_DEPARTMENT_OTHER): Payer: Self-pay | Admitting: Emergency Medicine

## 2017-08-19 ENCOUNTER — Emergency Department (HOSPITAL_BASED_OUTPATIENT_CLINIC_OR_DEPARTMENT_OTHER)
Admission: EM | Admit: 2017-08-19 | Discharge: 2017-08-19 | Disposition: A | Discharge status: Home / Self Care | Payer: Medicaid Other | Attending: Emergency Medicine | Admitting: Emergency Medicine

## 2017-08-19 ENCOUNTER — Other Ambulatory Visit: Payer: Self-pay

## 2017-08-19 DIAGNOSIS — R51 Headache: Secondary | ICD-10-CM | POA: Insufficient documentation

## 2017-08-19 DIAGNOSIS — Z5321 Procedure and treatment not carried out due to patient leaving prior to being seen by health care provider: Secondary | ICD-10-CM | POA: Insufficient documentation

## 2017-08-19 LAB — URINALYSIS, MICROSCOPIC (REFLEX)

## 2017-08-19 LAB — URINALYSIS, ROUTINE W REFLEX MICROSCOPIC
BILIRUBIN URINE: NEGATIVE
GLUCOSE, UA: NEGATIVE mg/dL
KETONES UR: NEGATIVE mg/dL
Leukocytes, UA: NEGATIVE
Nitrite: NEGATIVE
PROTEIN: NEGATIVE mg/dL
Specific Gravity, Urine: 1.015 (ref 1.005–1.030)
pH: 6.5 (ref 5.0–8.0)

## 2017-08-19 LAB — COMPREHENSIVE METABOLIC PANEL
ALT: 15 U/L (ref 14–54)
AST: 23 U/L (ref 15–41)
Albumin: 4.1 g/dL (ref 3.5–5.0)
Alkaline Phosphatase: 70 U/L (ref 38–126)
Anion gap: 7 (ref 5–15)
BUN: 9 mg/dL (ref 6–20)
CHLORIDE: 105 mmol/L (ref 101–111)
CO2: 28 mmol/L (ref 22–32)
Calcium: 9.1 mg/dL (ref 8.9–10.3)
Creatinine, Ser: 0.68 mg/dL (ref 0.44–1.00)
GFR calc Af Amer: >60 mL/min (ref >60)
GFR calc non Af Amer: >60 mL/min (ref >60)
Glucose, Bld: 90 mg/dL (ref 65–99)
Potassium: 3.9 mmol/L (ref 3.5–5.1)
Sodium: 140 mmol/L (ref 135–145)
Total Bilirubin: 0.5 mg/dL (ref 0.3–1.2)
Total Protein: 7.3 g/dL (ref 6.5–8.1)

## 2017-08-19 LAB — CBC
HCT: 35.5 % — ABNORMAL LOW (ref 36.0–46.0)
Hemoglobin: 11.9 g/dL — ABNORMAL LOW (ref 12.0–15.0)
MCH: 26.3 pg (ref 26.0–34.0)
MCHC: 33.5 g/dL (ref 30.0–36.0)
MCV: 78.5 fL (ref 78.0–100.0)
PLATELETS: 270 10*3/uL (ref 150–400)
RBC: 4.52 MIL/uL (ref 3.87–5.11)
RDW: 13.7 % (ref 11.5–15.5)
WBC: 4.8 10*3/uL (ref 4.0–10.5)

## 2017-08-19 LAB — LIPASE, BLOOD: Lipase: 38 U/L (ref 11–51)

## 2017-08-19 LAB — PREGNANCY, URINE: Preg Test, Ur: NEGATIVE

## 2017-08-19 MED ORDER — ONDANSETRON 4 MG PO TBDP
4.0000 mg | ORAL_TABLET | Freq: Once | ORAL | Status: AC | PRN
  Administered 2017-08-19: 4 mg via ORAL
  Filled 2017-08-19: qty 1

## 2017-08-19 NOTE — ED Triage Notes (Signed)
Pt reports drinking "a lot" on Saturday. She doesn't remember much about the night. States she has been vomiting since Saturday with headache.

## 2017-08-19 NOTE — ED Notes (Signed)
Patient no longer in the waiting room.  

## 2017-08-21 ENCOUNTER — Other Ambulatory Visit: Payer: Self-pay

## 2017-08-21 ENCOUNTER — Emergency Department (HOSPITAL_BASED_OUTPATIENT_CLINIC_OR_DEPARTMENT_OTHER)
Admission: EM | Admit: 2017-08-21 | Discharge: 2017-08-21 | Disposition: A | Discharge status: Home / Self Care | Payer: Medicaid Other | Attending: Emergency Medicine | Admitting: Emergency Medicine

## 2017-08-21 ENCOUNTER — Encounter (HOSPITAL_BASED_OUTPATIENT_CLINIC_OR_DEPARTMENT_OTHER): Payer: Self-pay

## 2017-08-21 DIAGNOSIS — R112 Nausea with vomiting, unspecified: Secondary | ICD-10-CM | POA: Insufficient documentation

## 2017-08-21 DIAGNOSIS — E860 Dehydration: Secondary | ICD-10-CM

## 2017-08-21 LAB — COMPREHENSIVE METABOLIC PANEL
ALBUMIN: 4 g/dL (ref 3.5–5.0)
ALT: 15 U/L (ref 14–54)
ANION GAP: 7 (ref 5–15)
AST: 20 U/L (ref 15–41)
Alkaline Phosphatase: 76 U/L (ref 38–126)
BILIRUBIN TOTAL: 0.1 mg/dL — AB (ref 0.3–1.2)
BUN: 9 mg/dL (ref 6–20)
CHLORIDE: 103 mmol/L (ref 101–111)
CO2: 27 mmol/L (ref 22–32)
Calcium: 8.9 mg/dL (ref 8.9–10.3)
Creatinine, Ser: 0.78 mg/dL (ref 0.44–1.00)
GFR calc Af Amer: >60 mL/min (ref >60)
GLUCOSE: 91 mg/dL (ref 65–99)
POTASSIUM: 3.7 mmol/L (ref 3.5–5.1)
Sodium: 137 mmol/L (ref 135–145)
TOTAL PROTEIN: 7.3 g/dL (ref 6.5–8.1)

## 2017-08-21 LAB — URINALYSIS, MICROSCOPIC (REFLEX)

## 2017-08-21 LAB — URINALYSIS, ROUTINE W REFLEX MICROSCOPIC
Bilirubin Urine: NEGATIVE
Glucose, UA: NEGATIVE mg/dL
Ketones, ur: NEGATIVE mg/dL
LEUKOCYTES UA: NEGATIVE
NITRITE: NEGATIVE
PH: 6.5 (ref 5.0–8.0)
Protein, ur: NEGATIVE mg/dL
SPECIFIC GRAVITY, URINE: 1.015 (ref 1.005–1.030)

## 2017-08-21 LAB — CBC
HEMATOCRIT: 34.4 % — AB (ref 36.0–46.0)
HEMOGLOBIN: 11.4 g/dL — AB (ref 12.0–15.0)
MCH: 25.9 pg — ABNORMAL LOW (ref 26.0–34.0)
MCHC: 33.1 g/dL (ref 30.0–36.0)
MCV: 78.2 fL (ref 78.0–100.0)
Platelets: 278 10*3/uL (ref 150–400)
RBC: 4.4 MIL/uL (ref 3.87–5.11)
RDW: 13.6 % (ref 11.5–15.5)
WBC: 7.7 10*3/uL (ref 4.0–10.5)

## 2017-08-21 LAB — PREGNANCY, URINE: Preg Test, Ur: NEGATIVE

## 2017-08-21 LAB — LIPASE, BLOOD: Lipase: 42 U/L (ref 11–51)

## 2017-08-21 MED ORDER — ONDANSETRON HCL 4 MG/2ML IJ SOLN
4.0000 mg | Freq: Once | INTRAMUSCULAR | Status: AC
  Administered 2017-08-21: 4 mg via INTRAVENOUS
  Filled 2017-08-21: qty 2

## 2017-08-21 MED ORDER — SODIUM CHLORIDE 0.9 % IV BOLUS (SEPSIS)
1000.0000 mL | Freq: Once | INTRAVENOUS | Status: AC
  Administered 2017-08-21: 1000 mL via INTRAVENOUS

## 2017-08-21 MED ORDER — ONDANSETRON 4 MG PO TBDP
4.0000 mg | ORAL_TABLET | Freq: Once | ORAL | Status: AC | PRN
  Administered 2017-08-21: 4 mg via ORAL
  Filled 2017-08-21: qty 1

## 2017-08-21 MED ORDER — ONDANSETRON HCL 4 MG PO TABS: 4.0000 mg | ORAL_TABLET | Freq: Three times a day (TID) | ORAL | 0 refills | Status: AC | PRN

## 2017-08-21 NOTE — ED Triage Notes (Signed)
Pt c/o n/v, lightheaded x 5 days-states sx started after drinking ETOH 3/9-NAD-steady gait

## 2017-08-21 NOTE — ED Provider Notes (Signed)
[facility] EMERGENCY DEPARTMENT Provider Note   CSN: [id number] Arrival date & time: 08/21/17  1620     History   Chief Complaint Chief Complaint  Patient presents with  . Emesis    HPI Christy Carroll is a 42 y.o. female.  The history is provided by the patient and medical records. No language interpreter was used.  Emesis   This is a new problem. The current episode started more than 2 days ago. The problem occurs 2 to 4 times per day. The problem has not changed since onset.The emesis has an appearance of stomach contents. There has been no fever. Pertinent negatives include no abdominal pain, no chills, no cough, no diarrhea, no fever, no headaches, no myalgias, no sweats and no URI.    Past Medical History:  Diagnosis Date  . Trichomonosis     There are no active problems to display for this patient.   Past Surgical History:  Procedure Laterality Date  . INDUCED ABORTION      OB History    No data available       Home Medications    Prior to Admission medications   Medication Sig Start Date End Date Taking? Authorizing Provider  amoxicillin (AMOXIL) 500 MG tablet Take 500 mg by mouth 2 (two) times daily.    [provider]  clotrimazole (MYCELEX) 10 MG troche Take 10 mg by mouth 5 (five) times daily.    [provider]  doxycycline (VIBRAMYCIN) 100 MG capsule Take 1 capsule (100 mg total) by mouth 2 (two) times daily. One po bid x 14 days 05/03/17   Palumbo, April, MD  HYDROcodone-acetaminophen (NORCO/VICODIN) 5-325 MG tablet Take 1-2 tablets by mouth every 6 hours as needed for pain. 07/15/16   Pisciotta, Joni Reining, PA-C  lidocaine (XYLOCAINE) 2 % solution Use as directed 15 mLs in the mouth or throat every 3 (three) hours as needed for mouth pain. 02/11/17   McDonald, Mia A, PA-C  metroNIDAZOLE (FLAGYL) 500 MG tablet Take 1 tablet (500 mg total) by mouth 2 (two) times daily. One po bid x 7 days 06/01/17   Jacalyn Lefevre, MD  ondansetron  (ZOFRAN) 4 MG tablet Take 1 tablet (4 mg total) by mouth every 8 (eight) hours as needed for nausea or vomiting. 07/15/16   Pisciotta, Joni Reining, PA-C    Family History No family history on file.  Social History Social History   Tobacco Use  . Smoking status: Never Smoker  . Smokeless tobacco: Never Used  Substance Use Topics  . Alcohol use: Yes    Comment: occ  . Drug use: No     Allergies   Patient has no known allergies.   Review of Systems Review of Systems  Constitutional: Positive for fatigue. Negative for chills, diaphoresis and fever.  HENT: Negative for congestion.   Respiratory: Negative for cough, chest tightness, shortness of breath and stridor.   Gastrointestinal: Positive for nausea and vomiting. Negative for abdominal distention, abdominal pain, constipation and diarrhea.  Genitourinary: Negative for dysuria.  Musculoskeletal: Negative for back pain, myalgias, neck pain and neck stiffness.  Neurological: Negative for dizziness, light-headedness and headaches.  Psychiatric/Behavioral: Negative for agitation and confusion.  All other systems reviewed and are negative.    Physical Exam Updated Vital Signs BP 134/86 (BP Location: Left Arm)   Pulse 89   Temp 98.9 F (37.2 C) (Oral)   Resp 18   LMP 08/14/2017   SpO2 100%   Physical Exam  Constitutional: She  is oriented to person, place, and time. She appears well-developed and well-nourished. No distress.  HENT:  Head: Normocephalic.  Nose: Nose normal.  Mouth/Throat: Oropharynx is clear and moist. No oropharyngeal exudate.  Eyes: Conjunctivae and EOM are normal. Pupils are equal, round, and reactive to light.  Neck: Normal range of motion.  Cardiovascular: Normal rate and intact distal pulses.  No murmur heard. Pulmonary/Chest: Effort normal and breath sounds normal. No respiratory distress. She has no wheezes. She has no rales. She exhibits no tenderness.  Abdominal: Soft. Bowel sounds are normal. She  exhibits no distension. There is no tenderness. There is no rebound. No hernia.  Musculoskeletal: She exhibits no edema or tenderness.  Neurological: She is alert and oriented to person, place, and time. No cranial nerve deficit or sensory deficit. She exhibits normal muscle tone.  Skin: Capillary refill takes less than 2 seconds. No rash noted. She is not diaphoretic. No erythema.  Psychiatric: She has a normal mood and affect.  Nursing note and vitals reviewed.    ED Treatments / Results  Labs (all labs ordered are listed, but only abnormal results are displayed) Labs Reviewed  COMPREHENSIVE METABOLIC PANEL - Abnormal; Notable for the following components:      Result Value   Total Bilirubin 0.1 (*)    All other components within normal limits  CBC - Abnormal; Notable for the following components:   Hemoglobin 11.4 (*)    HCT 34.4 (*)    MCH 25.9 (*)    All other components within normal limits  URINALYSIS, ROUTINE W REFLEX MICROSCOPIC - Abnormal; Notable for the following components:   APPearance HAZY (*)    Hgb urine dipstick SMALL (*)    All other components within normal limits  URINALYSIS, MICROSCOPIC (REFLEX) - Abnormal; Notable for the following components:   Bacteria, UA FEW (*)    Squamous Epithelial / LPF 6-30 (*)    All other components within normal limits  LIPASE, BLOOD  PREGNANCY, URINE    EKG  EKG Interpretation None       Radiology No results found.  Procedures Procedures (including critical care time)  Medications Ordered in ED Medications  ondansetron (ZOFRAN-ODT) disintegrating tablet 4 mg (4 mg Oral Given 08/21/17 1633)  sodium chloride 0.9 % bolus 1,000 mL (0 mLs Intravenous Stopped 08/21/17 2000)  sodium chloride 0.9 % bolus 1,000 mL (0 mLs Intravenous Stopped 08/21/17 2159)  ondansetron (ZOFRAN) injection 4 mg (4 mg Intravenous Given 08/21/17 1926)     Initial Impression / Assessment and Plan / ED Course  I have reviewed the triage vital  signs and the nursing notes.  Pertinent labs & imaging results that were available during my care of the patient were reviewed by me and considered in my medical decision making (see chart for details).     Christy Carroll is a 42 y.o. female with no significant past medical history who presents with 1 week of nausea vomiting and fatigue.  Patient says that on Saturday, 6 days ago, she drank alcohol for the first time in a long time.  She reports that she does not typically consume alcohol.  She reports that she drank a tequila shot with a strawberry lemonade chaser and then had several shots of Hennessy after.  Patient does not remember what happened next but blacked out.  She reports that she had nausea and vomiting.  Patient was feeling very dehydrated and nauseous all day the next day.  Patient reports that she has been  feeling very dehydrated and fatigued all week and has had intermittent nausea.  Patient reports she vomited again yesterday.  Patient presented for evaluation to look for dehydration or other abnormalities.  Patient denied other drug use medication use or other problems.  She denies chest pain, shortness breath, palpitations, syncope, abdominal pain, constipation, diarrhea, or urinary symptoms.  Patient reports that she just was on her menstrual cycle.  On exam, lungs are clear.  Chest is nontender.  No focal neurologic deficits.  Patient is alert.  Patient had normal speech and had nontender abdomen.  Nontender chest and lungs are clear.  Legs are nonedematous.  Patient appears well.  Patient is screening laboratory testing to look for electrode abnormalities or dehydration.  Lipase not elevated.  No evidence of pancreatitis.  Metabolic panel reassuring.  CBC showed similar anemia but no leukocytosis.  Urinalysis did not show infection.  I suspect patient was dehydrated from the significant nausea and vomiting over the weekend from her intoxication.  Patient has likely been feeling  however subsequently.  Patient will be given more nausea medicine and fluids.  Next  If patient has improved symptoms given the reassuring workup, patient will be safe for discharge home.  Patient will much better after fluids and nausea medicine.  Patient was able to tolerate p.o. and can maintain hydration at home.  Patient discharged in good condition with improvement in symptoms.  Patient no longer appears dehydrated.  Patient will follow up with PCP.  Patient discharged in good condition.   Final Clinical Impressions(s) / ED Diagnoses   Final diagnoses:  Dehydration  Non-intractable vomiting with nausea, unspecified vomiting type    ED Discharge Orders        Ordered    ondansetron (ZOFRAN) 4 MG tablet  Every 8 hours PRN     08/21/17 2304      Clinical Impression: 1. Dehydration   2. Non-intractable vomiting with nausea, unspecified vomiting type     Disposition: Discharge  Condition: Good  I have discussed the results, Dx and Tx plan with the pt(& family if present). He/she/they expressed understanding and agree(s) with the plan. Discharge instructions discussed at great length. Strict return precautions discussed and pt &/or family have verbalized understanding of the instructions. No further questions at time of discharge.    Discharge Medication List as of 08/21/2017 11:05 PM    START taking these medications   Details  !! ondansetron (ZOFRAN) 4 MG tablet Take 1 tablet (4 mg total) by mouth every 8 (eight) hours as needed., Starting Thu 08/21/2017, Print     !! - Potential duplicate medications found. Please discuss with provider.      Follow Up: Dahlia BailiffWright, Darren, MD [address] [place] [id number] [phone]     [facility] [address] [id number] HQ [id number] [place] [id number] [phone]       Tegeler, Canary Brimhristopher J, MD 08/22/17 0000

## 2017-08-21 NOTE — Discharge Instructions (Signed)
Your workup today showed no evidence of severe electrolyte abnormality or organ injury from your nausea vomiting and dehydration.  I suspect it was due to your drinking over this last weekend.  Please stay hydrated and use your nausea medicine as needed.  Please follow-up with your primary doctor in several days.  If any symptoms change or worsen, please return to the nearest emergency department.

## 2018-12-17 IMAGING — CR DG CHEST 2V
2 series · 2 of 2 positions shown · non-contrast
Comparison: Prior chest x-ray 07/15/2016

CLINICAL DATA: 41-year-old female with mid chest and upper back
pain for the past 4 days

EXAM:
CHEST  2 VIEW

[w chest pa]
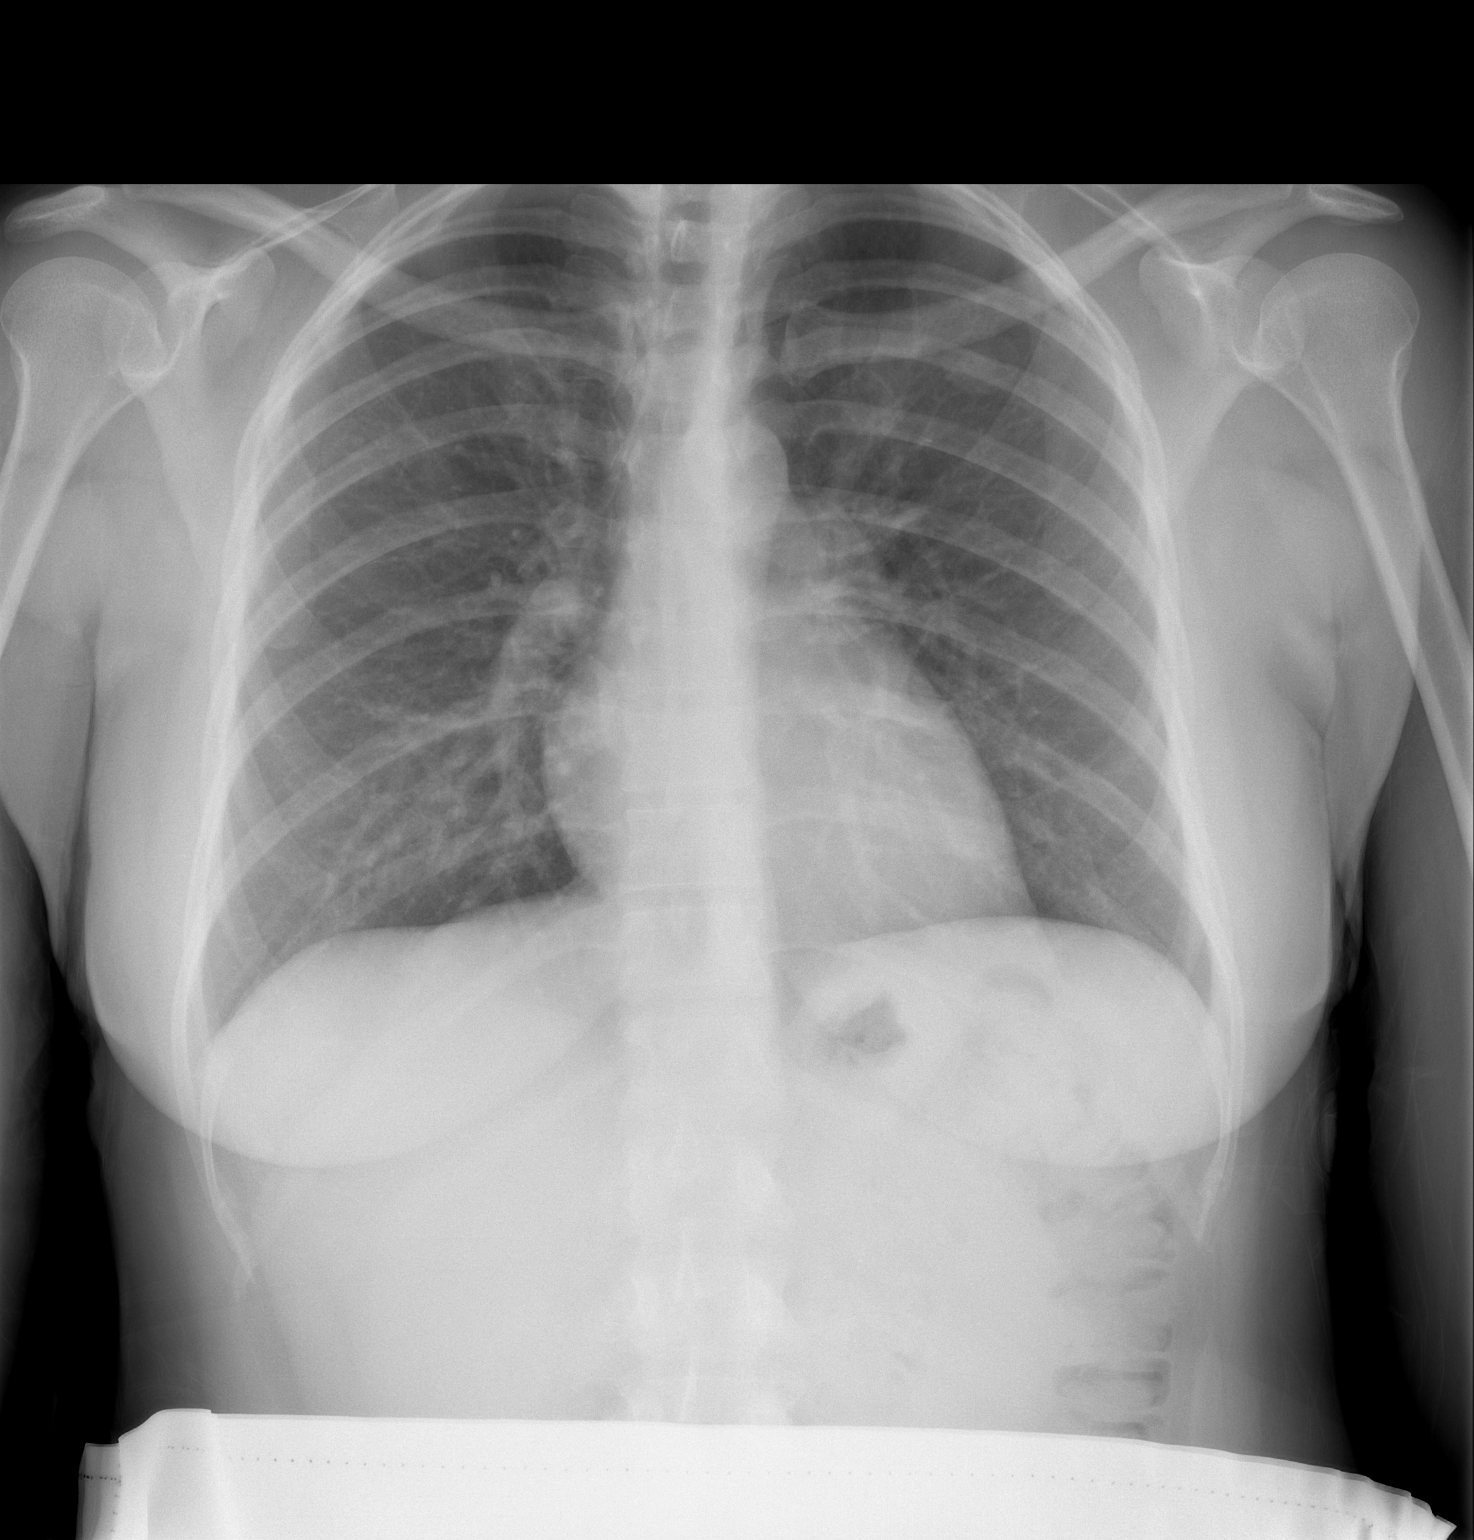

[w chest lat]
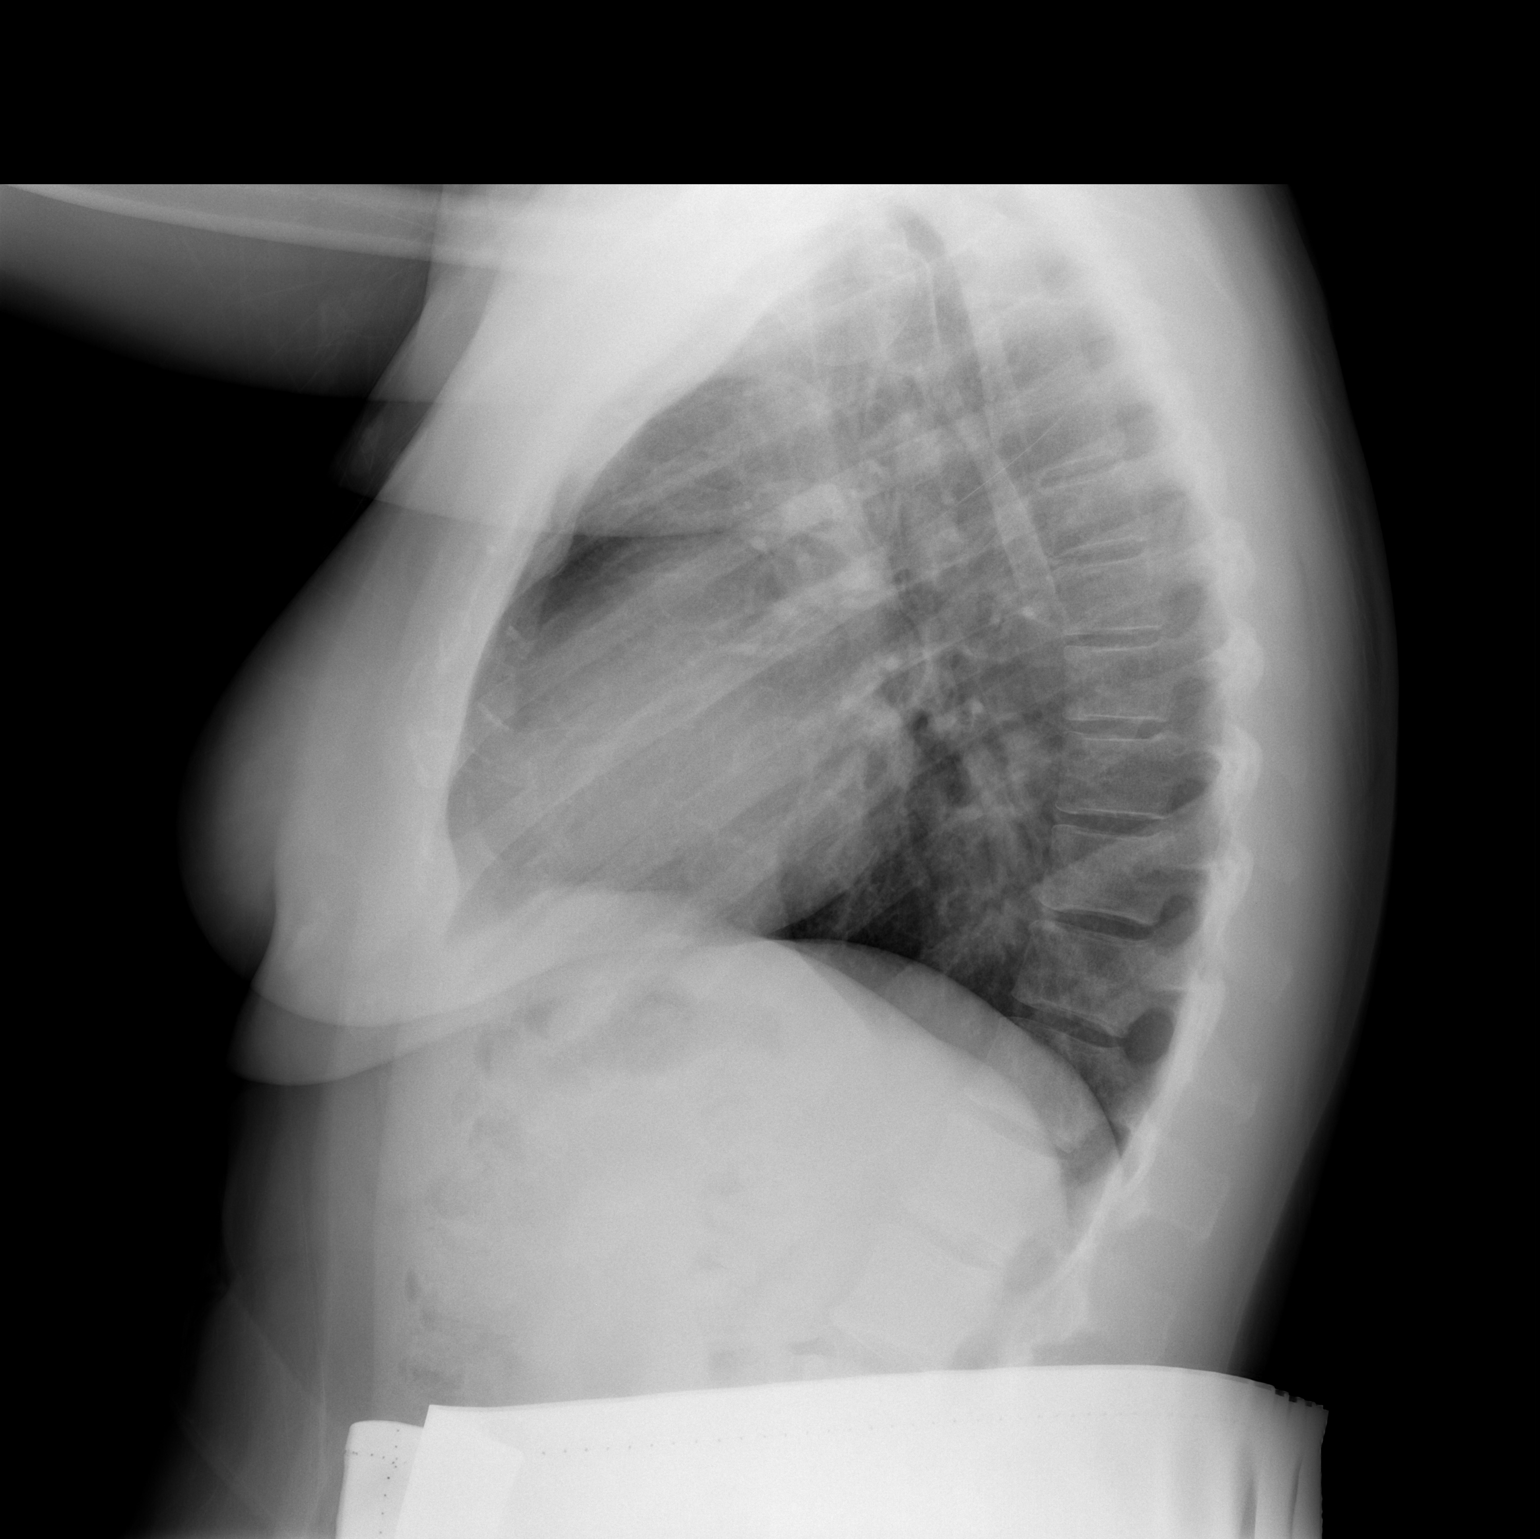

[2 of 2 positions shown; findings below may reference images not displayed]

FINDINGS: The lungs are clear and negative for focal airspace consolidation,
pulmonary edema or suspicious pulmonary nodule. No pleural effusion
or pneumothorax. Cardiac and mediastinal contours are within normal
limits. No acute fracture or lytic or blastic osseous lesions. The
visualized upper abdominal bowel gas pattern is unremarkable.
IMPRESSION: Negative chest x-ray.
